# Patient Record
Sex: Male | Born: 1965 | Race: White | Hispanic: No | Marital: Single | Smoking: Never smoker
Health system: Southern US, Community
[De-identification: ages and names within clinical notes are randomized; demographics above are authoritative.]

## PROBLEM LIST (undated history)

## (undated) DIAGNOSIS — R51 Headache: Secondary | ICD-10-CM

## (undated) DIAGNOSIS — K5190 Ulcerative colitis, unspecified, without complications: Secondary | ICD-10-CM

## (undated) DIAGNOSIS — M545 Low back pain, unspecified: Secondary | ICD-10-CM

## (undated) DIAGNOSIS — G8929 Other chronic pain: Secondary | ICD-10-CM

## (undated) HISTORY — PX: OTHER SURGICAL HISTORY: SHX169

## (undated) HISTORY — DX: Ulcerative colitis, unspecified, without complications: K51.90

---

## 1999-02-05 ENCOUNTER — Ambulatory Visit (HOSPITAL_BASED_OUTPATIENT_CLINIC_OR_DEPARTMENT_OTHER): Admission: RE | Admit: 1999-02-05 | Discharge: 1999-02-05 | Payer: Self-pay | Admitting: General Surgery

## 1999-04-09 ENCOUNTER — Ambulatory Visit (HOSPITAL_BASED_OUTPATIENT_CLINIC_OR_DEPARTMENT_OTHER): Admission: RE | Admit: 1999-04-09 | Discharge: 1999-04-09 | Payer: Self-pay | Admitting: General Surgery

## 1999-08-12 ENCOUNTER — Ambulatory Visit (HOSPITAL_COMMUNITY): Admission: RE | Admit: 1999-08-12 | Discharge: 1999-08-12 | Payer: Self-pay | Admitting: Gastroenterology

## 1999-09-22 ENCOUNTER — Ambulatory Visit (HOSPITAL_BASED_OUTPATIENT_CLINIC_OR_DEPARTMENT_OTHER): Admission: RE | Admit: 1999-09-22 | Discharge: 1999-09-22 | Payer: Self-pay | Admitting: General Surgery

## 1999-10-30 ENCOUNTER — Emergency Department (HOSPITAL_COMMUNITY): Admission: EM | Admit: 1999-10-30 | Discharge: 1999-10-30 | Payer: Self-pay | Admitting: Emergency Medicine

## 2007-04-02 ENCOUNTER — Emergency Department (HOSPITAL_COMMUNITY): Admission: EM | Admit: 2007-04-02 | Discharge: 2007-04-02 | Payer: Self-pay | Admitting: Family Medicine

## 2008-01-12 ENCOUNTER — Emergency Department (HOSPITAL_COMMUNITY): Admission: EM | Admit: 2008-01-12 | Discharge: 2008-01-12 | Payer: Self-pay | Admitting: Family Medicine

## 2009-06-11 ENCOUNTER — Ambulatory Visit (HOSPITAL_BASED_OUTPATIENT_CLINIC_OR_DEPARTMENT_OTHER): Admission: RE | Admit: 2009-06-11 | Discharge: 2009-06-11 | Payer: Self-pay | Admitting: Orthopedic Surgery

## 2009-06-11 DIAGNOSIS — L02818 Cutaneous abscess of other sites: Secondary | ICD-10-CM

## 2009-06-11 DIAGNOSIS — L03818 Cellulitis of other sites: Secondary | ICD-10-CM

## 2009-06-24 ENCOUNTER — Encounter (INDEPENDENT_AMBULATORY_CARE_PROVIDER_SITE_OTHER): Payer: Self-pay | Admitting: *Deleted

## 2009-07-03 ENCOUNTER — Ambulatory Visit: Payer: Self-pay | Admitting: Infectious Diseases

## 2009-07-03 DIAGNOSIS — K5289 Other specified noninfective gastroenteritis and colitis: Secondary | ICD-10-CM

## 2009-07-03 DIAGNOSIS — A319 Mycobacterial infection, unspecified: Secondary | ICD-10-CM | POA: Insufficient documentation

## 2009-07-15 ENCOUNTER — Encounter: Payer: Self-pay | Admitting: Internal Medicine

## 2009-07-29 ENCOUNTER — Encounter: Payer: Self-pay | Admitting: Infectious Diseases

## 2010-03-18 ENCOUNTER — Emergency Department (HOSPITAL_COMMUNITY): Admission: EM | Admit: 2010-03-18 | Discharge: 2010-03-18 | Payer: Self-pay | Admitting: Family Medicine

## 2010-09-16 NOTE — Consult Note (Signed)
Summary: [facility]  [facility]   Imported By: Florinda Marker 08/28/2009 15:05:21  _____________________________________________________________________  External Attachment:    Type:   Image     Comment:   External Document

## 2010-11-20 LAB — ANAEROBIC CULTURE

## 2010-11-20 LAB — CULTURE, ROUTINE-ABSCESS: Culture: NO GROWTH

## 2010-11-20 LAB — AFB CULTURE WITH SMEAR (NOT AT ARMC)
Acid Fast Smear: NONE SEEN
Acid Fast Smear: NONE SEEN

## 2010-11-20 LAB — POCT HEMOGLOBIN-HEMACUE: Hemoglobin: 17.4 g/dL — ABNORMAL HIGH (ref 13.0–17.0)

## 2010-11-20 LAB — TISSUE CULTURE: Culture: NO GROWTH

## 2010-12-16 ENCOUNTER — Inpatient Hospital Stay (HOSPITAL_COMMUNITY)
Admission: RE | Admit: 2010-12-16 | Discharge: 2010-12-16 | Disposition: A | Discharge status: Home / Self Care | Payer: 59 | Source: Ambulatory Visit | Attending: Emergency Medicine | Admitting: Emergency Medicine

## 2010-12-17 ENCOUNTER — Inpatient Hospital Stay (INDEPENDENT_AMBULATORY_CARE_PROVIDER_SITE_OTHER)
Admission: RE | Admit: 2010-12-17 | Discharge: 2010-12-17 | Disposition: A | Discharge status: Home / Self Care | Payer: 59 | Source: Ambulatory Visit | Attending: Family Medicine | Admitting: Family Medicine

## 2010-12-17 ENCOUNTER — Ambulatory Visit (INDEPENDENT_AMBULATORY_CARE_PROVIDER_SITE_OTHER): Payer: 59

## 2010-12-17 DIAGNOSIS — S62309A Unspecified fracture of unspecified metacarpal bone, initial encounter for closed fracture: Secondary | ICD-10-CM

## 2011-01-02 NOTE — Op Note (Signed)
[place]. [facility]  Patient:    Joseph Gibson                    MRN: [id number] Proc. Date: 09/22/99 Adm. Date:  [id number] Attending:  Glenna Fellows Tappan                           Operative Report  PREOPERATIVE DIAGNOSIS:  Anal fissure.  POSTOPERATIVE DIAGNOSIS:  Anal fissure.  PROCEDURE:  Fissurectomy.  SURGEON:  Lorne Skeens. Hoxworth, M.D.  ANESTHESIA:  General.  INDICATIONS:  Joseph Gibson is a 45 year old white male who initially presented with a typical posterior midline fissure.  He has undergone internal anal sphincterotomy but failed to heal the fissure.  I have returned to the operating room on one previous occasion and performed an anal dilatation and a conservative fissurectomy.  He, however, has continued to have discomfort, bleeding, and a nonhealing fissure.  He has had a flexible sigmoidoscopy that is negative and an anal rectal ultrasound was unremarkable.  He has failed to improve on conservative management and after a consultation with GI, and with another of my associates, we have elected to return him to the operating room for a wider fissurectomy, in an attempt to get back to healthy tissue and stimulate healing.  The nature of the  procedure, indications, the risks of bleeding, infection, and nonhealing were discussed and understood preoperatively.  DESCRIPTION OF PROCEDURE:  The patient was brought to the operating room and placed in the supine position on the operating room table and general endotracheal anesthesia was induced.  Antibiotics were given preoperatively.  The perineum was sterilely prepped and draped.  Examination of the anorectum revealed good relaxation from the previous internal anal sphincterotomy with no evidence of fibrosis or tightness to the sphincter whatsoever.  There was a large posterior  midline fissure, as well as a smaller fissure in the right lateral position at he site of  this previous sphincterotomy. On both of these areas I excised anoderm nd mucosa back several mm to healthy tissue and extended this out, well out onto the perianal skin.  The base of each fissure was also curetted back to fresh tissue. There were no signs of hemorrhoids or other inflammatory processes in the anus.  The operative sites and perirectal area were infiltrated with Marcaine.  There as no significant bleeding at the end of the procedure.  A gauze dressing was applied, and the patient was taken to the recovery room in satisfactory condition. DD:  09/22/99 TD:  09/22/99 Job: [id number]

## 2011-01-02 NOTE — Procedures (Signed)
[place]. [facility]  Patient:    Joseph Gibson                    MRN: [id number] Proc. Date: 08/12/99 Adm. Date:  [id number] Attending:  Starr Sinclair CC:         Lorne Skeens. Hoxworth, M.D.                           Procedure Report  PROCEDURE:  Flexible sigmoidoscopy with a rectal and pelvic ultrasound.  ENDOSCOPIST:  Venita Lick. Pleas Koch., M.D. [facility].  REFERRING PHYSICIAN:  Sharlet Salina T. Hoxworth, M.D.  INDICATIONS:  A 45 year old white male with post defecation rectal pain and small volumes of hematochezia.  He has had a chronic anal fissure and underwent an anal sphincterotomy, and then, an internal sphincterotomy with dilation, and excision of the fissure.  Both operations were performed this past summer.  He has had persistent symptoms.  PHYSICAL EXAMINATION:  Chest clear to auscultation.  Cardiac regular rate and rhythm without murmurs.  Neurologic alert and oriented x 3.  ANESTHESIA:  Fentanyl 125 mcg IV and Versed 12.5 mg IV.  MONITORING:  Automated blood pressure monitor, pulse oximeter, and cardiac monitor. Low flow oxygen was given by nasal cannula throughout the procedure.  The procedure was well-tolerated with no immediate complications.  DESCRIPTION OF PROCEDURE:  After the nature of the procedure was discussed with the patient including the discussion of its risks, benefits and alternatives, he consented to proceed.  He was then comfortably sedated in the left lateral decubitus position.  Digital rectal examination revealed some small external anal tags.  There was a very subtle mucosal defect in the posterior midline aspect at the anal canal.  The remainder of the digital rectal examination was unremarkable. The Olympus echoendoscope was inserted into the rectal vault.  Air was insufflated, and the scope was advanced to approximately 30 cm in the proximal sigmoid colon. The sigmoid colon and rectum appeared  unremarkable.  I could not easily retroflex the echoendoscope, and therefore, detailed _________, and therefore, a retroflex view was not performed.  Close inspection on turning on the scope 360 degrees around the anal canal and the distal rectum revealed no obvious abnormalities.  Ultrasound imaging was then performed at 12 mHz and 7.5 mHz with the majority of the exam at 7.5 mHz.  Various ranges were applied with the majority of the exam  being performed at the 9 cm range.  The prostate gland and other adjacent pelvic structures appeared to be normal.  The rectal wall from the mid rectum into the  anal canal also appeared to be normal.  There was no evidence of any abscess, fistula, or mass lesion.  The rectal wall was measured and the proximal anal canal at 7 mm.  Video footage and still images were obtained.  Video photographs were  obtained.  The colon was decompressed and the echoendoscope was removed from the patient.  IMPRESSION: 1. Flexible sigmoidoscopy to 30 cm. 2. External anal tags. 3. Small mucosal defect in the posterior midline of the anal canal. 4. Normal rectal and pelvic ultrasound.  RECOMMENDATIONS:  Followup with Dr. Johna Sheriff. DD:  08/12/99 TD:  08/13/99 Job: [id number]

## 2011-05-13 LAB — GC/CHLAMYDIA PROBE AMP, GENITAL: Chlamydia, DNA Probe: NEGATIVE

## 2011-05-29 LAB — GC/CHLAMYDIA PROBE AMP, GENITAL: GC Probe Amp, Genital: NEGATIVE

## 2011-05-29 LAB — POCT URINALYSIS DIP (DEVICE)
Operator id: 270961
Protein, ur: NEGATIVE
Urobilinogen, UA: 1

## 2011-07-17 ENCOUNTER — Emergency Department (HOSPITAL_COMMUNITY)
Admission: EM | Admit: 2011-07-17 | Discharge: 2011-07-17 | Payer: 59 | Source: Home / Self Care | Attending: Emergency Medicine | Admitting: Emergency Medicine

## 2012-07-28 ENCOUNTER — Other Ambulatory Visit (HOSPITAL_COMMUNITY): Payer: Self-pay | Admitting: Orthopedic Surgery

## 2012-07-28 DIAGNOSIS — M25521 Pain in right elbow: Secondary | ICD-10-CM

## 2012-08-01 ENCOUNTER — Ambulatory Visit (HOSPITAL_COMMUNITY): Payer: Self-pay

## 2012-08-27 ENCOUNTER — Encounter: Payer: Self-pay | Admitting: Family Medicine

## 2012-08-27 ENCOUNTER — Ambulatory Visit (INDEPENDENT_AMBULATORY_CARE_PROVIDER_SITE_OTHER): Payer: 59 | Admitting: Family Medicine

## 2012-08-27 ENCOUNTER — Ambulatory Visit: Payer: 59

## 2012-08-27 VITALS — BP 126/79 | HR 83 | Temp 98.1°F | Resp 18 | Ht 70.75 in | Wt 167.0 lb

## 2012-08-27 DIAGNOSIS — M545 Low back pain, unspecified: Secondary | ICD-10-CM

## 2012-08-27 DIAGNOSIS — T148XXA Other injury of unspecified body region, initial encounter: Secondary | ICD-10-CM

## 2012-08-27 DIAGNOSIS — M79605 Pain in left leg: Secondary | ICD-10-CM

## 2012-08-27 DIAGNOSIS — M79609 Pain in unspecified limb: Secondary | ICD-10-CM

## 2012-08-27 MED ORDER — METHOCARBAMOL 500 MG PO TABS: 500.0000 mg | ORAL_TABLET | Freq: Every evening | ORAL | Status: DC | PRN

## 2012-08-27 MED ORDER — OXYCODONE-ACETAMINOPHEN 5-325 MG PO TABS: 1.0000 | ORAL_TABLET | Freq: Three times a day (TID) | ORAL | Status: DC | PRN

## 2012-08-27 MED ORDER — METHYLPREDNISOLONE 4 MG PO KIT: PACK | ORAL | Status: DC

## 2012-08-27 MED ORDER — DICLOFENAC SODIUM 75 MG PO TBEC: 75.0000 mg | DELAYED_RELEASE_TABLET | Freq: Two times a day (BID) | ORAL | Status: DC

## 2012-08-27 NOTE — Progress Notes (Signed)
[facility]:  Office Visit  Chief Complaint:  Chief Complaint  Patient presents with  . Back Pain    injury 2 wks ago LBP - fell last pm twisted w/pain radiating butt to calf    HPI: Joseph Gibson is a 47 y.o. 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 This took 2 weeks to resolve. Yesterday twisted back while in yard due to lost of balance. Has sharp, throbbing, pulsating pain in left mid buttcheek radiating to posterior thigh and calf stops at ankle. Has gone to chiropractor before for intermittent back problems. No prior back surgeries. +Pulsating, throbbing, tingling, Worse with movement.  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 Pain increases with prolong sitting, standing, moving.   Past Medical History  Diagnosis Date  . Ulcerative colitis    Past Surgical History  Procedure Date  . Sting ray  infection    History   Social History  . Marital Status: Single    Spouse Name: N/A    Number of Children: N/A  . Years of Education: N/A   Social History Main Topics  . Smoking status: Never Smoker   . Smokeless tobacco: None  . Alcohol Use: No  . Drug Use: No  . Sexually Active:    Other Topics Concern  . None   Social History Narrative  . None   Family History  Problem Relation Age of Onset  . Diabetes Father    Allergies no known allergies Prior to Admission medications   Medication Sig Start Date End Date Taking? Authorizing Provider  azaTHIOprine (IMURAN) 50 MG tablet Take 150 mg by mouth daily.   Yes Historical Provider, MD  mesalamine (ASACOL) 400 MG EC tablet Take 800 mg by mouth 2 (two) times daily. For crohns   Yes Historical Provider, MD     ROS: The patient denies fevers, chills, night sweats, unintentional weight loss, chest pain, palpitations, wheezing, dyspnea on exertion, nausea, vomiting, abdominal pain, dysuria, hematuria, melena, ,  weakness  All other systems have been reviewed and were otherwise negative with the exception of those mentioned in the HPI and as above.    PHYSICAL EXAM: Filed Vitals:   08/27/12 1024  BP: 126/79  Pulse: 83  Temp: 98.1 F (36.7 C)  Resp: 18   Filed Vitals:   08/27/12 1024  Height: 5' 10.75" (1.797 m)  Weight: 167 lb (75.751 kg)   Body mass index is 23.46 kg/(m^2).  General: Alert, no acute distress HEENT:  Normocephalic, atraumatic, oropharynx patent.  Cardiovascular:  Regular rate and rhythm, no rubs murmurs or gallops.  No Carotid bruits, radial pulse intact. No pedal edema.  Respiratory: Clear to auscultation bilaterally.  No wheezes, rales, or rhonchi.  No cyanosis, no use of accessory musculature GI: No organomegaly, abdomen is soft and non-tender, positive bowel sounds.  No masses. Skin: No rashes. Neurologic: Facial musculature symmetric. Psychiatric: Patient is appropriate throughout our interaction. Lymphatic: No cervical lymphadenopathy Musculoskeletal: Gait intact but stiff L-spine-tender at center of left buttock Decrease ROM in flexion, extension and SB 5/5 strength, sensation intact, no saddle anesthesia + straight leg   LABS:    EKG/XRAY:   Primary read interpreted by Dr. Conley Rolls at [facility]. No fracture/dislocation   ASSESSMENT/PLAN: Encounter Diagnoses  Name Primary?  . Low back pain radiating to left leg Yes  . Sprain and strain   .  Paresthesia and pain of left extremity    ? Sciatica vs Piriformis sydrome Rx: Steroid burst, Diclofenac 75 mg BID, Percocet 5/325 mg q8h, Robaxen  Gross drug SEs d/w patient. He denies any substance abuse/dependence issues ROM exercises. Advise to modify activities as needed. F/u in 1 week     Ferrell Flam PHUONG, DO 08/27/2012 11:51 AM

## 2012-08-28 ENCOUNTER — Encounter: Payer: Self-pay | Admitting: Family Medicine

## 2012-10-06 ENCOUNTER — Telehealth: Payer: Self-pay

## 2012-10-06 NOTE — Telephone Encounter (Signed)
Printed. Placed at front desk.  Called patient to advise.

## 2012-10-06 NOTE — Telephone Encounter (Signed)
Patient called and states he failed a drug test for the [employer].  He would like to know if the prescriptions he was issued could be the causing the positive test result.  Please call him on his cell [phone].

## 2012-10-07 ENCOUNTER — Encounter (HOSPITAL_COMMUNITY): Payer: Self-pay

## 2012-10-07 ENCOUNTER — Encounter (HOSPITAL_COMMUNITY): Payer: Self-pay | Admitting: Pharmacy Technician

## 2012-10-07 ENCOUNTER — Other Ambulatory Visit: Payer: Self-pay | Admitting: Neurosurgery

## 2012-10-09 MED ORDER — CEFAZOLIN SODIUM-DEXTROSE 2-3 GM-% IV SOLR
2.0000 g | INTRAVENOUS | Status: AC
  Administered 2012-10-10: 2 g via INTRAVENOUS
  Filled 2012-10-09: qty 50

## 2012-10-10 ENCOUNTER — Encounter (HOSPITAL_COMMUNITY): Payer: Self-pay | Admitting: *Deleted

## 2012-10-10 ENCOUNTER — Observation Stay (HOSPITAL_COMMUNITY)
Admission: RE | Admit: 2012-10-10 | Discharge: 2012-10-11 | Disposition: A | Discharge status: Home / Self Care | Payer: 59 | Source: Ambulatory Visit | Attending: Neurosurgery | Admitting: Neurosurgery

## 2012-10-10 ENCOUNTER — Inpatient Hospital Stay (HOSPITAL_COMMUNITY): Payer: 59 | Admitting: Anesthesiology

## 2012-10-10 ENCOUNTER — Encounter (HOSPITAL_COMMUNITY)
Admission: RE | Disposition: A | Discharge status: Home / Self Care | Payer: Self-pay | Source: Ambulatory Visit | Attending: Neurosurgery

## 2012-10-10 ENCOUNTER — Encounter (HOSPITAL_COMMUNITY): Payer: Self-pay | Admitting: Anesthesiology

## 2012-10-10 ENCOUNTER — Inpatient Hospital Stay (HOSPITAL_COMMUNITY): Payer: 59

## 2012-10-10 DIAGNOSIS — M5126 Other intervertebral disc displacement, lumbar region: Principal | ICD-10-CM | POA: Insufficient documentation

## 2012-10-10 HISTORY — DX: Low back pain, unspecified: M54.50

## 2012-10-10 HISTORY — DX: Other chronic pain: G89.29

## 2012-10-10 HISTORY — DX: Headache: R51

## 2012-10-10 HISTORY — PX: LUMBAR LAMINECTOMY/DECOMPRESSION MICRODISCECTOMY: SHX5026

## 2012-10-10 HISTORY — DX: Low back pain: M54.5

## 2012-10-10 LAB — BASIC METABOLIC PANEL WITH GFR
BUN: 14 mg/dL (ref 6–23)
CO2: 28 meq/L (ref 19–32)
Calcium: 8.9 mg/dL (ref 8.4–10.5)
Chloride: 103 meq/L (ref 96–112)
Creatinine, Ser: 1.15 mg/dL (ref 0.50–1.35)
GFR calc Af Amer: 87 mL/min — ABNORMAL LOW
GFR calc non Af Amer: 75 mL/min — ABNORMAL LOW
Glucose, Bld: 83 mg/dL (ref 70–99)
Potassium: 4 meq/L (ref 3.5–5.1)
Sodium: 138 meq/L (ref 135–145)

## 2012-10-10 LAB — CBC WITH DIFFERENTIAL/PLATELET
Eosinophils Absolute: 0 10*3/uL (ref 0.0–0.7)
Eosinophils Relative: 0 % (ref 0–5)
Lymphs Abs: 0.9 10*3/uL (ref 0.7–4.0)
MCH: 32 pg (ref 26.0–34.0)
MCV: 90.7 fL (ref 78.0–100.0)
Monocytes Relative: 14 % — ABNORMAL HIGH (ref 3–12)
Platelets: 180 10*3/uL (ref 150–400)
RBC: 4.53 MIL/uL (ref 4.22–5.81)

## 2012-10-10 SURGERY — LUMBAR LAMINECTOMY/DECOMPRESSION MICRODISCECTOMY 1 LEVEL
Anesthesia: General | Site: Back | Laterality: Left | Wound class: Clean

## 2012-10-10 MED ORDER — ALUM & MAG HYDROXIDE-SIMETH 200-200-20 MG/5ML PO SUSP: 30.0000 mL | Freq: Four times a day (QID) | ORAL | Status: DC | PRN

## 2012-10-10 MED ORDER — SODIUM CHLORIDE 0.9 % IJ SOLN
3.0000 mL | Freq: Two times a day (BID) | INTRAMUSCULAR | Status: DC
  Administered 2012-10-10 – 2012-10-11 (×2): 3 mL via INTRAVENOUS

## 2012-10-10 MED ORDER — ONDANSETRON HCL 4 MG/2ML IJ SOLN: 4.0000 mg | Freq: Four times a day (QID) | INTRAMUSCULAR | Status: DC | PRN

## 2012-10-10 MED ORDER — CEFAZOLIN SODIUM 1-5 GM-% IV SOLN
1.0000 g | Freq: Three times a day (TID) | INTRAVENOUS | Status: AC
  Administered 2012-10-11 (×2): 1 g via INTRAVENOUS
  Filled 2012-10-10 (×2): qty 50

## 2012-10-10 MED ORDER — LACTATED RINGERS IV SOLN
INTRAVENOUS | Status: DC | PRN
  Administered 2012-10-10 (×2): via INTRAVENOUS

## 2012-10-10 MED ORDER — BUPIVACAINE HCL (PF) 0.25 % IJ SOLN
INTRAMUSCULAR | Status: DC | PRN
  Administered 2012-10-10: 20 mL

## 2012-10-10 MED ORDER — OXYCODONE-ACETAMINOPHEN 5-325 MG PO TABS
1.0000 | ORAL_TABLET | ORAL | Status: DC | PRN
  Administered 2012-10-10 – 2012-10-11 (×2): 2 via ORAL
  Filled 2012-10-10 (×2): qty 2

## 2012-10-10 MED ORDER — 0.9 % SODIUM CHLORIDE (POUR BTL) OPTIME
TOPICAL | Status: DC | PRN
  Administered 2012-10-10: 1000 mL

## 2012-10-10 MED ORDER — SODIUM CHLORIDE 0.9 % IV SOLN: 250.0000 mL | INTRAVENOUS | Status: DC

## 2012-10-10 MED ORDER — ACETAMINOPHEN 650 MG RE SUPP: 650.0000 mg | RECTAL | Status: DC | PRN

## 2012-10-10 MED ORDER — OXYCODONE HCL 5 MG PO TABS: 5.0000 mg | ORAL_TABLET | Freq: Once | ORAL | Status: DC | PRN

## 2012-10-10 MED ORDER — MESALAMINE 400 MG PO CPDR
400.0000 mg | DELAYED_RELEASE_CAPSULE | Freq: Two times a day (BID) | ORAL | Status: DC
  Filled 2012-10-10 (×3): qty 1

## 2012-10-10 MED ORDER — PHENYLEPHRINE HCL 10 MG/ML IJ SOLN
INTRAMUSCULAR | Status: DC | PRN
  Administered 2012-10-10: 80 ug via INTRAVENOUS

## 2012-10-10 MED ORDER — HYDROMORPHONE HCL PF 1 MG/ML IJ SOLN: 0.5000 mg | INTRAMUSCULAR | Status: DC | PRN

## 2012-10-10 MED ORDER — ROCURONIUM BROMIDE 100 MG/10ML IV SOLN
INTRAVENOUS | Status: DC | PRN
  Administered 2012-10-10: 50 mg via INTRAVENOUS

## 2012-10-10 MED ORDER — ZOLPIDEM TARTRATE 5 MG PO TABS: 5.0000 mg | ORAL_TABLET | Freq: Every evening | ORAL | Status: DC | PRN

## 2012-10-10 MED ORDER — MUPIROCIN 2 % EX OINT
TOPICAL_OINTMENT | Freq: Once | CUTANEOUS | Status: AC
  Administered 2012-10-10: 1 via NASAL
  Filled 2012-10-10: qty 22

## 2012-10-10 MED ORDER — NEOSTIGMINE METHYLSULFATE 1 MG/ML IJ SOLN
INTRAMUSCULAR | Status: DC | PRN
  Administered 2012-10-10: 5 mg via INTRAVENOUS

## 2012-10-10 MED ORDER — LIDOCAINE HCL (CARDIAC) 20 MG/ML IV SOLN
INTRAVENOUS | Status: DC | PRN
  Administered 2012-10-10: 50 mg via INTRAVENOUS

## 2012-10-10 MED ORDER — THROMBIN 5000 UNITS EX SOLR
CUTANEOUS | Status: DC | PRN
  Administered 2012-10-10 (×2): 5000 [IU] via TOPICAL

## 2012-10-10 MED ORDER — SODIUM CHLORIDE 0.9 % IV SOLN
INTRAVENOUS | Status: AC
  Filled 2012-10-10: qty 500

## 2012-10-10 MED ORDER — SODIUM CHLORIDE 0.9 % IR SOLN
Status: DC | PRN
  Administered 2012-10-10: 17:00:00

## 2012-10-10 MED ORDER — ONDANSETRON HCL 4 MG/2ML IJ SOLN: 4.0000 mg | INTRAMUSCULAR | Status: DC | PRN

## 2012-10-10 MED ORDER — HYDROCODONE-ACETAMINOPHEN 5-325 MG PO TABS: 1.0000 | ORAL_TABLET | ORAL | Status: DC | PRN

## 2012-10-10 MED ORDER — PHENOL 1.4 % MT LIQD: 1.0000 | OROMUCOSAL | Status: DC | PRN

## 2012-10-10 MED ORDER — FENTANYL CITRATE 0.05 MG/ML IJ SOLN
INTRAMUSCULAR | Status: DC | PRN
  Administered 2012-10-10 (×2): 50 ug via INTRAVENOUS
  Administered 2012-10-10: 100 ug via INTRAVENOUS
  Administered 2012-10-10: 50 ug via INTRAVENOUS

## 2012-10-10 MED ORDER — OXYCODONE HCL 5 MG/5ML PO SOLN: 5.0000 mg | Freq: Once | ORAL | Status: DC | PRN

## 2012-10-10 MED ORDER — HEMOSTATIC AGENTS (NO CHARGE) OPTIME
TOPICAL | Status: DC | PRN
  Administered 2012-10-10: 1 via TOPICAL

## 2012-10-10 MED ORDER — MENTHOL 3 MG MT LOZG: 1.0000 | LOZENGE | OROMUCOSAL | Status: DC | PRN

## 2012-10-10 MED ORDER — KETOROLAC TROMETHAMINE 30 MG/ML IJ SOLN
30.0000 mg | Freq: Four times a day (QID) | INTRAMUSCULAR | Status: DC
  Administered 2012-10-11 (×2): 30 mg via INTRAVENOUS
  Filled 2012-10-10 (×6): qty 1

## 2012-10-10 MED ORDER — MUPIROCIN 2 % EX OINT
TOPICAL_OINTMENT | CUTANEOUS | Status: AC
  Filled 2012-10-10: qty 22

## 2012-10-10 MED ORDER — BACITRACIN 50000 UNITS IM SOLR
INTRAMUSCULAR | Status: AC
  Filled 2012-10-10: qty 1

## 2012-10-10 MED ORDER — ACETAMINOPHEN 325 MG PO TABS: 650.0000 mg | ORAL_TABLET | ORAL | Status: DC | PRN

## 2012-10-10 MED ORDER — SODIUM CHLORIDE 0.9 % IJ SOLN: 3.0000 mL | INTRAMUSCULAR | Status: DC | PRN

## 2012-10-10 MED ORDER — ONDANSETRON HCL 4 MG/2ML IJ SOLN
INTRAMUSCULAR | Status: DC | PRN
  Administered 2012-10-10: 4 mg via INTRAVENOUS

## 2012-10-10 MED ORDER — CYCLOBENZAPRINE HCL 10 MG PO TABS
10.0000 mg | ORAL_TABLET | Freq: Three times a day (TID) | ORAL | Status: DC | PRN
  Administered 2012-10-10: 10 mg via ORAL
  Filled 2012-10-10: qty 1

## 2012-10-10 MED ORDER — KETOROLAC TROMETHAMINE 30 MG/ML IJ SOLN
INTRAMUSCULAR | Status: DC | PRN
  Administered 2012-10-10: 30 mg via INTRAVENOUS

## 2012-10-10 MED ORDER — MESALAMINE 400 MG PO TBEC: 800.0000 mg | DELAYED_RELEASE_TABLET | Freq: Two times a day (BID) | ORAL | Status: DC

## 2012-10-10 MED ORDER — AZATHIOPRINE 50 MG PO TABS
150.0000 mg | ORAL_TABLET | Freq: Every day | ORAL | Status: DC
  Filled 2012-10-10: qty 3

## 2012-10-10 MED ORDER — MIDAZOLAM HCL 5 MG/5ML IJ SOLN
INTRAMUSCULAR | Status: DC | PRN
  Administered 2012-10-10: 2 mg via INTRAVENOUS

## 2012-10-10 MED ORDER — PROPOFOL 10 MG/ML IV BOLUS
INTRAVENOUS | Status: DC | PRN
  Administered 2012-10-10: 200 mg via INTRAVENOUS

## 2012-10-10 MED ORDER — GLYCOPYRROLATE 0.2 MG/ML IJ SOLN
INTRAMUSCULAR | Status: DC | PRN
  Administered 2012-10-10: 0.6 mg via INTRAVENOUS

## 2012-10-10 MED ORDER — SENNA 8.6 MG PO TABS
1.0000 | ORAL_TABLET | Freq: Two times a day (BID) | ORAL | Status: DC
  Administered 2012-10-10: 8.6 mg via ORAL
  Filled 2012-10-10 (×3): qty 1

## 2012-10-10 MED ORDER — HYDROMORPHONE HCL PF 1 MG/ML IJ SOLN: 0.2500 mg | INTRAMUSCULAR | Status: DC | PRN

## 2012-10-10 MED ORDER — DEXAMETHASONE SODIUM PHOSPHATE 10 MG/ML IJ SOLN
10.0000 mg | INTRAMUSCULAR | Status: AC
  Administered 2012-10-10: 10 mg via INTRAVENOUS
  Filled 2012-10-10: qty 1

## 2012-10-10 SURGICAL SUPPLY — 54 items
ADH SKN CLS APL DERMABOND .7 (GAUZE/BANDAGES/DRESSINGS) ×1
APL SKNCLS STERI-STRIP NONHPOA (GAUZE/BANDAGES/DRESSINGS) ×1
BAG DECANTER FOR FLEXI CONT (MISCELLANEOUS) ×2 IMPLANT
BENZOIN TINCTURE PRP APPL 2/3 (GAUZE/BANDAGES/DRESSINGS) ×2 IMPLANT
BLADE SURG ROTATE 9660 (MISCELLANEOUS) ×1 IMPLANT
BRUSH SCRUB EZ PLAIN DRY (MISCELLANEOUS) ×2 IMPLANT
BUR CUTTER 7.0 ROUND (BURR) ×2 IMPLANT
CANISTER SUCTION 2500CC (MISCELLANEOUS) ×2 IMPLANT
CLOTH BEACON ORANGE TIMEOUT ST (SAFETY) ×2 IMPLANT
CONT SPEC 4OZ CLIKSEAL STRL BL (MISCELLANEOUS) ×2 IMPLANT
DECANTER SPIKE VIAL GLASS SM (MISCELLANEOUS) ×2 IMPLANT
DERMABOND ADVANCED (GAUZE/BANDAGES/DRESSINGS) ×1
DERMABOND ADVANCED .7 DNX12 (GAUZE/BANDAGES/DRESSINGS) ×1 IMPLANT
DRAPE LAPAROTOMY 100X72X124 (DRAPES) ×2 IMPLANT
DRAPE MICROSCOPE LEICA (MISCELLANEOUS) ×1 IMPLANT
DRAPE MICROSCOPE ZEISS OPMI (DRAPES) ×1 IMPLANT
DRAPE POUCH INSTRU U-SHP 10X18 (DRAPES) ×2 IMPLANT
DRAPE PROXIMA HALF (DRAPES) IMPLANT
DRAPE SURG 17X23 STRL (DRAPES) ×4 IMPLANT
ELECT REM PT RETURN 9FT ADLT (ELECTROSURGICAL) ×2
ELECTRODE REM PT RTRN 9FT ADLT (ELECTROSURGICAL) ×1 IMPLANT
GAUZE SPONGE 4X4 16PLY XRAY LF (GAUZE/BANDAGES/DRESSINGS) IMPLANT
GLOVE BIO SURGEON STRL SZ 6.5 (GLOVE) ×2 IMPLANT
GLOVE BIOGEL PI IND STRL 6.5 (GLOVE) IMPLANT
GLOVE BIOGEL PI INDICATOR 6.5 (GLOVE) ×1
GLOVE ECLIPSE 7.5 STRL STRAW (GLOVE) ×1 IMPLANT
GLOVE ECLIPSE 8.5 STRL (GLOVE) ×2 IMPLANT
GLOVE EXAM NITRILE LRG STRL (GLOVE) IMPLANT
GLOVE EXAM NITRILE MD LF STRL (GLOVE) ×1 IMPLANT
GLOVE EXAM NITRILE XL STR (GLOVE) IMPLANT
GLOVE EXAM NITRILE XS STR PU (GLOVE) IMPLANT
GLOVE INDICATOR 8.0 STRL GRN (GLOVE) ×1 IMPLANT
GOWN BRE IMP SLV AUR LG STRL (GOWN DISPOSABLE) ×1 IMPLANT
GOWN BRE IMP SLV AUR XL STRL (GOWN DISPOSABLE) ×3 IMPLANT
GOWN STRL REIN 2XL LVL4 (GOWN DISPOSABLE) IMPLANT
KIT BASIN OR (CUSTOM PROCEDURE TRAY) ×2 IMPLANT
KIT ROOM TURNOVER OR (KITS) ×2 IMPLANT
NDL SPNL 22GX3.5 QUINCKE BK (NEEDLE) ×1 IMPLANT
NEEDLE HYPO 22GX1.5 SAFETY (NEEDLE) ×2 IMPLANT
NEEDLE SPNL 22GX3.5 QUINCKE BK (NEEDLE) ×2 IMPLANT
NS IRRIG 1000ML POUR BTL (IV SOLUTION) ×2 IMPLANT
PACK LAMINECTOMY NEURO (CUSTOM PROCEDURE TRAY) ×2 IMPLANT
PAD ARMBOARD 7.5X6 YLW CONV (MISCELLANEOUS) ×6 IMPLANT
RUBBERBAND STERILE (MISCELLANEOUS) ×4 IMPLANT
SPONGE GAUZE 4X4 12PLY (GAUZE/BANDAGES/DRESSINGS) ×2 IMPLANT
SPONGE SURGIFOAM ABS GEL SZ50 (HEMOSTASIS) ×2 IMPLANT
STRIP CLOSURE SKIN 1/2X4 (GAUZE/BANDAGES/DRESSINGS) ×2 IMPLANT
SUT VIC AB 2-0 CT1 18 (SUTURE) ×2 IMPLANT
SUT VIC AB 3-0 SH 8-18 (SUTURE) ×2 IMPLANT
SYR 20ML ECCENTRIC (SYRINGE) ×2 IMPLANT
TAPE CLOTH SURG 4X10 WHT LF (GAUZE/BANDAGES/DRESSINGS) ×1 IMPLANT
TOWEL OR 17X24 6PK STRL BLUE (TOWEL DISPOSABLE) ×2 IMPLANT
TOWEL OR 17X26 10 PK STRL BLUE (TOWEL DISPOSABLE) ×2 IMPLANT
WATER STERILE IRR 1000ML POUR (IV SOLUTION) ×2 IMPLANT

## 2012-10-10 NOTE — Transfer of Care (Signed)
Immediate Anesthesia Transfer of Care Note  Patient: Joseph Gibson  Procedure(s) Performed: Procedure(s) with comments: LUMBAR LAMINECTOMY/DECOMPRESSION MICRODISCECTOMY 1 LEVEL (Left) - Left Lumbar Five-Sacral One Microdiskectomy  Patient Location: PACU  Anesthesia Type:General  Level of Consciousness: awake and alert   Airway & Oxygen Therapy: Patient Spontanous Breathing and Patient connected to face mask oxygen  Post-op Assessment: Report given to PACU RN and Post -op Vital signs reviewed and stable  Post vital signs: Reviewed and stable  Complications: No apparent anesthesia complications

## 2012-10-10 NOTE — Anesthesia Preprocedure Evaluation (Addendum)
Anesthesia Evaluation  Patient identified by MRN, date of birth, ID band Patient awake    Reviewed: Allergy & Precautions, H&P , NPO status , Patient's Chart, lab work & pertinent test results, reviewed documented beta blocker date and time   Airway Mallampati: II  Neck ROM: full    Dental  (+) Teeth Intact and Dental Advisory Given   Pulmonary          Cardiovascular     Neuro/Psych  Headaches,    GI/Hepatic PUD, Ulcerative colitis   Endo/Other    Renal/GU      Musculoskeletal   Abdominal   Peds  Hematology   Anesthesia Other Findings   Reproductive/Obstetrics                          Anesthesia Physical Anesthesia Plan  ASA: II  Anesthesia Plan: General   Post-op Pain Management:    Induction: Intravenous  Airway Management Planned: Oral ETT  Additional Equipment:   Intra-op Plan:   Post-operative Plan: Extubation in OR  Informed Consent: I have reviewed the patients History and Physical, chart, labs and discussed the procedure including the risks, benefits and alternatives for the proposed anesthesia with the patient or authorized representative who has indicated his/her understanding and acceptance.     Plan Discussed with: CRNA, Surgeon and Anesthesiologist  Anesthesia Plan Comments:        Anesthesia Quick Evaluation

## 2012-10-10 NOTE — Op Note (Signed)
Date of procedure: 10/10/2012  Date of dictation: Same  Service: Neurosurgery  Preoperative diagnosis: Left L5-S1 herniated nucleus pulposus with radiculopathy  Postoperative diagnosis: Same  Procedure Name: Left L5-S1 laminotomy and microdiscectomy  Surgeon:Maclaine Ahola A.Verdia Bolt, M.D.  Asst. Surgeon: Newell Coral  Anesthesia: General  Indication: 47 year old male with back and left lower chamois pain paresthesias and weakness consistent with a left-sided S1 radiculopathy. Workup demonstrates a large left-sided L5-S1 disc herniation with compression the left-sided S1 nerve root. Patient presents now for laminotomy and microdiscectomy in hopes of improving his symptoms.  Operative note: After induction anesthesia, patient positioned prone onto Wilson frame and appropriately padded. Lumbar region prepped and draped. Incision made overlying L5-S1 interspace. Supper periosteal dissection performed on the left side. Self-retaining retractors placed x-ray taken level confirmed. Laminotomy performed using high-speed drill and Kerrison rongeurs to remove the inferior aspect the lamina above medial aspect the L5-S1 facet joint and the superior rim of the S1 lamina. Ligament flavum was elevated and resected in piecemeal fashion to Kerrison rongeurs. Underlying thecal sac and S1 nerve root were notified. Microscope brought field these might resection a left-sided S1 nerve root and intravenous plexus. Epidural venous plexus was coagulated and cut. Thecal sac and S1 nerve root gently mobilized track towards midline. A large amount of free disc herniation encountered. This dissected free and then incised a 15 blade. Disc herniation was removed in piecemeal fashion using pituitary rongeurs. The spaces in her. Intradiscal decompression was also performed removing all loose her oxygen dispersement and interspace. This went very thorough decompression had been achieved. There is no his injury to thecal sac and nerve roots.  Wound is then irrigated out like solution. Gelfoam placed topically for hemostasis which Ascent be good. Microscope for her system were removed. Hemostasis of the muscle was achieved with electric R. Wounds and close in layers with Vicryl sutures. Steri-Strips and sterile dressing were applied. There were no apparent complications. Patient tolerated well and returns they're covering postop.

## 2012-10-10 NOTE — Brief Op Note (Signed)
10/10/2012  5:29 PM  PATIENT:  Joseph Gibson  47 y.o. male  PRE-OPERATIVE DIAGNOSIS:  HNP  POST-OPERATIVE DIAGNOSIS:  HNP  PROCEDURE:  Procedure(s) with comments: LUMBAR LAMINECTOMY/DECOMPRESSION MICRODISCECTOMY 1 LEVEL (Left) - Left Lumbar Five-Sacral One Microdiskectomy  SURGEON:  Surgeon(s) and Role:    * Temple Pacini, MD - Primary    * Hewitt Shorts, MD - Assisting  PHYSICIAN ASSISTANT:   ASSISTANTS:    ANESTHESIA:   general  EBL:  Total I/O In: 1000 [I.V.:1000] Out: 100 [Blood:100]  BLOOD ADMINISTERED:none  DRAINS: none   LOCAL MEDICATIONS USED:  MARCAINE     SPECIMEN:  No Specimen  DISPOSITION OF SPECIMEN:  N/A  COUNTS:  YES  TOURNIQUET:  * No tourniquets in log *  DICTATION: .Dragon Dictation  PLAN OF CARE: Admit for overnight observation  PATIENT DISPOSITION:  PACU - hemodynamically stable.   Delay start of Pharmacological VTE agent (>24hrs) due to surgical blood loss or risk of bleeding: yes

## 2012-10-10 NOTE — Anesthesia Procedure Notes (Signed)
Procedure Name: Intubation Date/Time: 10/10/2012 4:13 PM Performed by: Brien Mates DOBSON Pre-anesthesia Checklist: Patient identified, Emergency Drugs available, Suction available, Patient being monitored and Timeout performed Patient Re-evaluated:Patient Re-evaluated prior to inductionOxygen Delivery Method: Circle system utilized Preoxygenation: Pre-oxygenation with 100% oxygen Intubation Type: IV induction Ventilation: Mask ventilation without difficulty and Oral airway inserted - appropriate to patient size Laryngoscope Size: Miller and 2 Grade View: Grade I Tube size: 7.5 mm Number of attempts: 1 Airway Equipment and Method: Stylet Placement Confirmation: ETT inserted through vocal cords under direct vision,  positive ETCO2 and breath sounds checked- equal and bilateral Secured at: 22 cm Tube secured with: Tape Dental Injury: Teeth and Oropharynx as per pre-operative assessment

## 2012-10-10 NOTE — Anesthesia Postprocedure Evaluation (Signed)
  Anesthesia Post-op Note  Patient: Joseph Gibson  Procedure(s) Performed: Procedure(s) with comments: LUMBAR LAMINECTOMY/DECOMPRESSION MICRODISCECTOMY 1 LEVEL (Left) - Left Lumbar Five-Sacral One Microdiskectomy  Patient Location: PACU  Anesthesia Type:General  Level of Consciousness: awake, alert , oriented and patient cooperative  Airway and Oxygen Therapy: Patient Spontanous Breathing and Patient connected to nasal cannula oxygen  Post-op Pain: mild  Post-op Assessment: Post-op Vital signs reviewed, Patient's Cardiovascular Status Stable, Respiratory Function Stable, Patent Airway, No signs of Nausea or vomiting and Pain level controlled  Post-op Vital Signs: Reviewed and stable  Complications: No apparent anesthesia complications

## 2012-10-10 NOTE — H&P (Signed)
Joseph Gibson is an 47 y.o. male.   Chief Complaint: Left leg pain HPI: 47 year old male with severe left lower extremity pain with sensory loss and weakness. Workup demonstrates evidence of a large left-sided L5-S1 disc herniation with compression of the left-sided S1 nerve root. Patient has been counseled as to his options and he presents now for microdiscectomy in hopes of improving his symptoms.  Past Medical History  Diagnosis Date  . Ulcerative colitis   . Headache     "occas"  . Chronic low back pain     Past Surgical History  Procedure Laterality Date  . Sting ray  infection      Family History  Problem Relation Age of Onset  . Diabetes Father    Social History:  reports that he has never smoked. He does not have any smokeless tobacco history on file. He reports that he does not drink alcohol or use illicit drugs.  Allergies: No Known Allergies  Medications Prior to Admission  Medication Sig Dispense Refill  . Aspirin-Acetaminophen-Caffeine (GOODYS EXTRA STRENGTH PO) Take 1 packet by mouth 2 (two) times daily as needed (for pain).      Marland Kitchen azaTHIOprine (IMURAN) 50 MG tablet Take 150 mg by mouth daily.      Marland Kitchen ibuprofen (ADVIL,MOTRIN) 200 MG tablet Take 400 mg by mouth 2 (two) times daily as needed for pain.      . mesalamine (ASACOL) 400 MG EC tablet Take 800 mg by mouth 2 (two) times daily. For crohns        Results for orders placed during the hospital encounter of 10/10/12 (from the past 48 hour(s))  BASIC METABOLIC PANEL     Status: Abnormal   Collection Time    10/10/12  1:00 PM      Result Value Range   Sodium 138  135 - 145 mEq/L   Potassium 4.0  3.5 - 5.1 mEq/L   Chloride 103  96 - 112 mEq/L   CO2 28  19 - 32 mEq/L   Glucose, Bld 83  70 - 99 mg/dL   BUN 14  6 - 23 mg/dL   Creatinine, Ser 1.61  0.50 - 1.35 mg/dL   Calcium 8.9  8.4 - 09.6 mg/dL   GFR calc non Af Amer 75 (*) >90 mL/min   GFR calc Af Amer 87 (*) >90 mL/min   Comment:            The eGFR has  been calculated     using the CKD EPI equation.     This calculation has not been     validated in all clinical     situations.     eGFR's persistently     <90 mL/min signify     possible Chronic Kidney Disease.  CBC WITH DIFFERENTIAL     Status: Abnormal   Collection Time    10/10/12  1:00 PM      Result Value Range   WBC 5.6  4.0 - 10.5 K/uL   RBC 4.53  4.22 - 5.81 MIL/uL   Hemoglobin 14.5  13.0 - 17.0 g/dL   HCT 04.5  40.9 - 81.1 %   MCV 90.7  78.0 - 100.0 fL   MCH 32.0  26.0 - 34.0 pg   MCHC 35.3  30.0 - 36.0 g/dL   RDW 91.4  78.2 - 95.6 %   Platelets 180  150 - 400 K/uL   Neutrophils Relative 69  43 - 77 %  Neutro Abs 3.9  1.7 - 7.7 K/uL   Lymphocytes Relative 16  12 - 46 %   Lymphs Abs 0.9  0.7 - 4.0 K/uL   Monocytes Relative 14 (*) 3 - 12 %   Monocytes Absolute 0.8  0.1 - 1.0 K/uL   Eosinophils Relative 0  0 - 5 %   Eosinophils Absolute 0.0  0.0 - 0.7 K/uL   Basophils Relative 0  0 - 1 %   Basophils Absolute 0.0  0.0 - 0.1 K/uL  SURGICAL PCR SCREEN     Status: None   Collection Time    10/10/12  1:20 PM      Result Value Range   MRSA, PCR NEGATIVE  NEGATIVE   Staphylococcus aureus NEGATIVE  NEGATIVE   Comment:            The Xpert SA Assay (FDA     approved for NASAL specimens     in patients over 47 years of age),     is one component of     a comprehensive surveillance     program.  Test performance has     been validated by [employer] for patients greater     than or equal to 69 year old.     It is not intended     to diagnose infection nor to     guide or monitor treatment.   No results found.  Review of Systems  Constitutional: Negative.   HENT: Negative.   Eyes: Negative.   Respiratory: Negative.   Cardiovascular: Negative.   Gastrointestinal: Negative.   Genitourinary: Negative.   Musculoskeletal: Negative.   Skin: Negative.   Neurological: Negative.   Endo/Heme/Allergies: Negative.   Psychiatric/Behavioral: Negative.     Blood  pressure 147/81, pulse 84, temperature 97.3 F (36.3 C), temperature source Oral, resp. rate 20, height 5\' 11"  (1.803 m), weight 71.4 kg (157 lb 6.5 oz), SpO2 98.00%. Physical Exam  Constitutional: He is oriented to person, place, and time. He appears well-developed and well-nourished. No distress.  HENT:  Head: Normocephalic and atraumatic.  Right Ear: External ear normal.  Left Ear: External ear normal.  Nose: Nose normal.  Mouth/Throat: Oropharynx is clear and moist.  Eyes: Conjunctivae and EOM are normal. Pupils are equal, round, and reactive to light. Right eye exhibits no discharge. Left eye exhibits no discharge.  Neck: Normal range of motion. Neck supple. No tracheal deviation present. No thyromegaly present.  Cardiovascular: Normal rate, regular rhythm, normal heart sounds and intact distal pulses.  Exam reveals no friction rub.   No murmur heard. Respiratory: Effort normal and breath sounds normal. No respiratory distress. He has no wheezes.  GI: Soft. Bowel sounds are normal. He exhibits no distension. There is no tenderness.  Musculoskeletal: Normal range of motion. He exhibits no edema and no tenderness.  Neurological: He is alert and oriented to person, place, and time. He has normal reflexes. He displays normal reflexes. No cranial nerve deficit. He exhibits normal muscle tone. Coordination normal.  Positive left straight-leg raising sign. Left gastrocnemius 4 minus over 5 with wasting. Left S1 dermatome with diminished sensation to pinprick and light touch. Left Achilles reflex absent  Skin: Skin is warm and dry. No rash noted. He is not diaphoretic. No erythema. No pallor.  Psychiatric: He has a normal mood and affect. His behavior is normal. Judgment and thought content normal.     Assessment/Plan Left L5-S1 herniated nucleus pulposus with radiculopathy. Plan  left L5-S1 laminotomy and microdiscectomy. Risks and benefits have been explained. Patient wishes to  proceed.  [name] A 10/10/2012, 3:35 PM

## 2012-10-10 NOTE — Preoperative (Signed)
Beta Blockers   Reason not to administer Beta Blockers:Not Applicable 

## 2012-10-10 NOTE — Progress Notes (Signed)
Call to Dr. Jordan Likes, reported that orders for OR needed to be signed & released.

## 2012-10-11 ENCOUNTER — Encounter (HOSPITAL_COMMUNITY): Payer: Self-pay | Admitting: Neurosurgery

## 2012-10-11 NOTE — Progress Notes (Signed)
Pt doing well. Pt given D/C instructions with verbal understanding. Pt D/C'd home via wheelchair @ 1125 per MD order. Rema Fendt, RN

## 2012-10-11 NOTE — Progress Notes (Signed)
UR COMPLETED  

## 2012-10-11 NOTE — Discharge Summary (Signed)
Physician Discharge Summary  Patient ID: ORA MCNATT MRN: [id number] DOB/AGE: [birth year] 47 y.o.  Admit date: 10/10/2012 Discharge date: 10/11/2012  Admission Diagnoses:  Discharge Diagnoses:  Active Problems:   * No active hospital problems. *   Discharged Condition: good  Hospital Course: Patient admitted to the hospital where he underwent uncomplicated left-sided L5-S1 laminotomy and microdiscectomy. Postoperatively he is done well. Preoperative pain much improved. Ready for discharge home.  Consults:   Significant Diagnostic Studies:   Treatments:   Discharge Exam: Blood pressure 134/75, pulse 95, temperature 98.7 F (37.1 C), temperature source Oral, resp. rate 16, height 5\' 11"  (1.803 m), weight 71.4 kg (157 lb 6.5 oz), SpO2 94.00%. Awake and alert. Oriented and appropriate. Cranial nerve function is intact. Motor and sensory function of the extremities normal. Wound clean and dry. Chest and abdomen benign.  Disposition: ED Dismiss - Diverted Elsewhere     Medication List    TAKE these medications       azaTHIOprine 50 MG tablet  Commonly known as:  IMURAN  Take 150 mg by mouth daily.     GOODYS EXTRA STRENGTH PO  Take 1 packet by mouth 2 (two) times daily as needed (for pain).     ibuprofen 200 MG tablet  Commonly known as:  ADVIL,MOTRIN  Take 400 mg by mouth 2 (two) times daily as needed for pain.     mesalamine 400 MG EC tablet  Commonly known as:  ASACOL  Take 800 mg by mouth 2 (two) times daily. For crohns           Follow-up Information   Follow up with Eleina Jergens A, MD. Call in 1 week. (Ask for Lurena Joiner)    Contact information:   [address] [place] [address] [phone]       Signed: Julio Sicks A 10/11/2012, 10:38 AM

## 2013-05-19 ENCOUNTER — Emergency Department (HOSPITAL_COMMUNITY)
Admission: EM | Admit: 2013-05-19 | Discharge: 2013-05-19 | Disposition: A | Discharge status: Home / Self Care | Payer: 59 | Source: Home / Self Care | Attending: Emergency Medicine | Admitting: Emergency Medicine

## 2013-05-19 ENCOUNTER — Other Ambulatory Visit (HOSPITAL_COMMUNITY)
Admission: RE | Admit: 2013-05-19 | Discharge: 2013-05-19 | Disposition: A | Discharge status: Home / Self Care | Payer: 59 | Source: Ambulatory Visit | Attending: Emergency Medicine | Admitting: Emergency Medicine

## 2013-05-19 ENCOUNTER — Encounter (HOSPITAL_COMMUNITY): Payer: Self-pay | Admitting: Emergency Medicine

## 2013-05-19 DIAGNOSIS — Z113 Encounter for screening for infections with a predominantly sexual mode of transmission: Secondary | ICD-10-CM | POA: Insufficient documentation

## 2013-05-19 DIAGNOSIS — Z202 Contact with and (suspected) exposure to infections with a predominantly sexual mode of transmission: Secondary | ICD-10-CM

## 2013-05-19 DIAGNOSIS — Z9189 Other specified personal risk factors, not elsewhere classified: Secondary | ICD-10-CM

## 2013-05-19 DIAGNOSIS — R300 Dysuria: Secondary | ICD-10-CM

## 2013-05-19 LAB — POCT URINALYSIS DIP (DEVICE)
Glucose, UA: NEGATIVE mg/dL
Ketones, ur: NEGATIVE mg/dL
Specific Gravity, Urine: >=1.03 (ref 1.005–1.030)

## 2013-05-19 NOTE — ED Notes (Signed)
Patient reports having dark urine, odor to urine and an itch.  Patient asks to have std testing.

## 2013-05-19 NOTE — ED Provider Notes (Signed)
Chief Complaint:   Chief Complaint  Patient presents with  . Urinary Tract Infection    History of Present Illness:   Joseph Gibson is a 47 year old male who comes in tonight concerned about urinary symptoms. His urine has been dark yellow in color and has an unusual odor. He has started taking a multivitamin recently. He denies any dysuria, frequency, urgency, or hematuria. He's had no urethral discharge, penile pain, or penile lesions. He does have some itching of the head of the penis at times. His urinary stream is good he denies any prostatic pain, lower back, or lower double pain. He's had no fever, chills, nausea, or vomiting. He is sexually active with a single male partner. He does not use condoms for protection.  Review of Systems:  Other than noted above, the patient denies any of the following symptoms: General:  No fevers, chills, sweats, aches, or fatigue. GI:  No abdominal pain, back pain, nausea, vomiting, diarrhea, or constipation. GU:  No dysuria, frequency, urgency, hematuria, urethral discharge, penile lesions, penile pain, testicular pain, swelling, or mass, inguinal lymphadenopathy or incontinence.  PMFSH:  Past medical history, family history, social history, meds, and allergies were reviewed.  He has shows disease and takes Imuran and Asacol.  Physical Exam:   Vital signs:  BP 125/89  Pulse 99  Temp(Src) 98.4 F (36.9 C) (Oral)  Resp 16  SpO2 100% Gen:  Alert, oriented, in no distress. Lungs:  Clear to auscultation, no wheezes, rales or rhonchi. Heart:  Regular rhythm, no gallop or murmer. Abdomen:  Flat and soft.  No tenderness to palpation, guarding, or rebound.  No hepato-splenomegaly or mass.  Bowel sounds were normally active.  No hernia. Genital exam:  Was completely unremarkable. There was noted urethral discharge, no penile lesions, ulcers, blisters, or rash. He is known to lymphadenopathy. Testes were normal and nontender. Back:  No CVA tenderness.   Skin:  Clear, warm and dry.  Labs:   Results for orders placed during the hospital encounter of 05/19/13  POCT URINALYSIS DIP (DEVICE)      Result Value Range   Glucose, UA NEGATIVE  NEGATIVE mg/dL   Bilirubin Urine NEGATIVE  NEGATIVE   Ketones, ur NEGATIVE  NEGATIVE mg/dL   Specific Gravity, Urine >=1.030  1.005 - 1.030   Hgb urine dipstick NEGATIVE  NEGATIVE   pH 7.0  5.0 - 8.0   Protein, ur NEGATIVE  NEGATIVE mg/dL   Urobilinogen, UA 1.0  0.0 - 1.0 mg/dL   Nitrite NEGATIVE  NEGATIVE   Leukocytes, UA NEGATIVE  NEGATIVE    DNA probes for gonorrhea, Chlamydia, Trichomonas were obtained as well as urine culture.  Assessment: The primary encounter diagnosis was Dysuria. A diagnosis of Potential exposure to STD was also pertinent to this visit.   There is no evidence of urinary tract infection or an STD tonight. The patient is inclined to hold off on treatment until results of tests come back. I told him we should everything back early next week and will give him a call if there any abnormal.  Plan:   1.  Meds:  The following meds were prescribed:   Discharge Medication List as of 05/19/2013  4:29 PM      2.  Patient Education/Counseling:  The patient was given appropriate handouts, self care instructions, and instructed in symptomatic relief.  Patient encouraged to use a condom with all intercourse for STD protection.  3.  Follow up:  The patient was told to follow  up if no better in 3 to 4 days, if becoming worse in any way, and given some red flag symptoms such as fever, difficulty urinating, or worsening pain which would prompt immediate return.  Follow up here if needed.      Reuben Likes, MD 05/19/13 [phone]

## 2013-05-20 LAB — URINE CULTURE: Colony Count: 15000

## 2013-09-25 IMAGING — CR DG LUMBAR SPINE COMPLETE 4+V
5 series · 5 of 5 positions shown · non-contrast
Comparison: None.

CLINICAL DATA: Injury, pain

LUMBAR SPINE - COMPLETE 4+ VIEW

[AP]
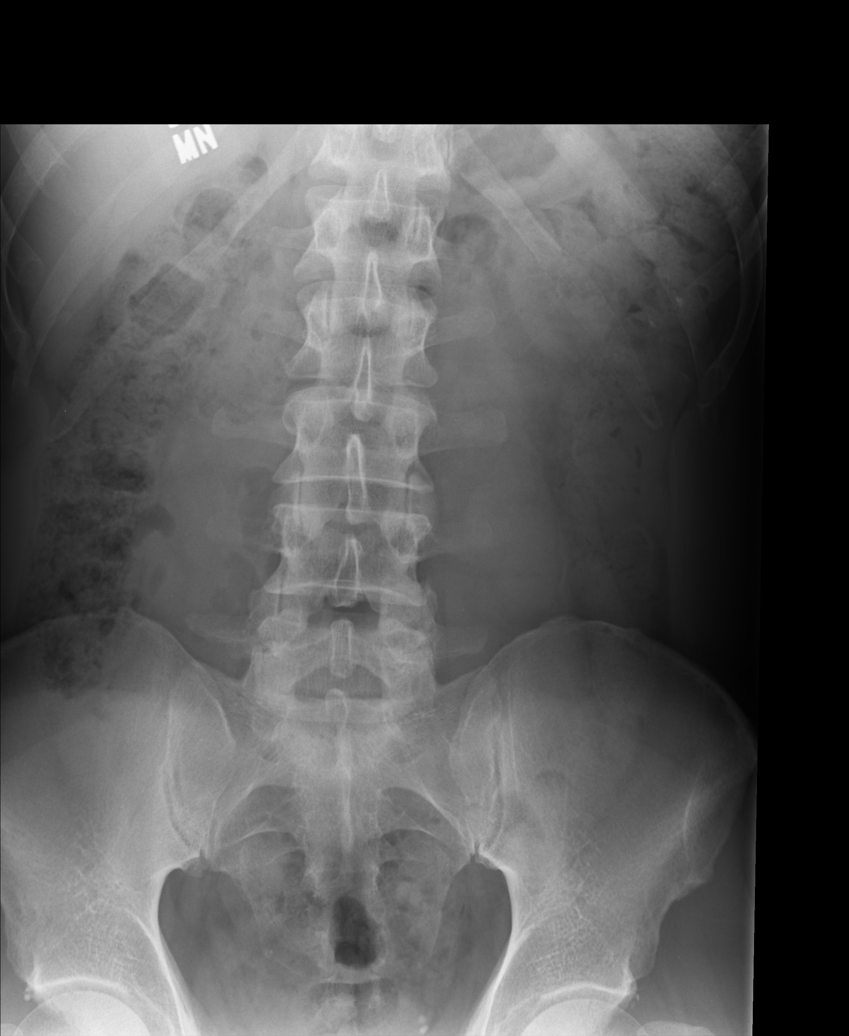

[rpo]
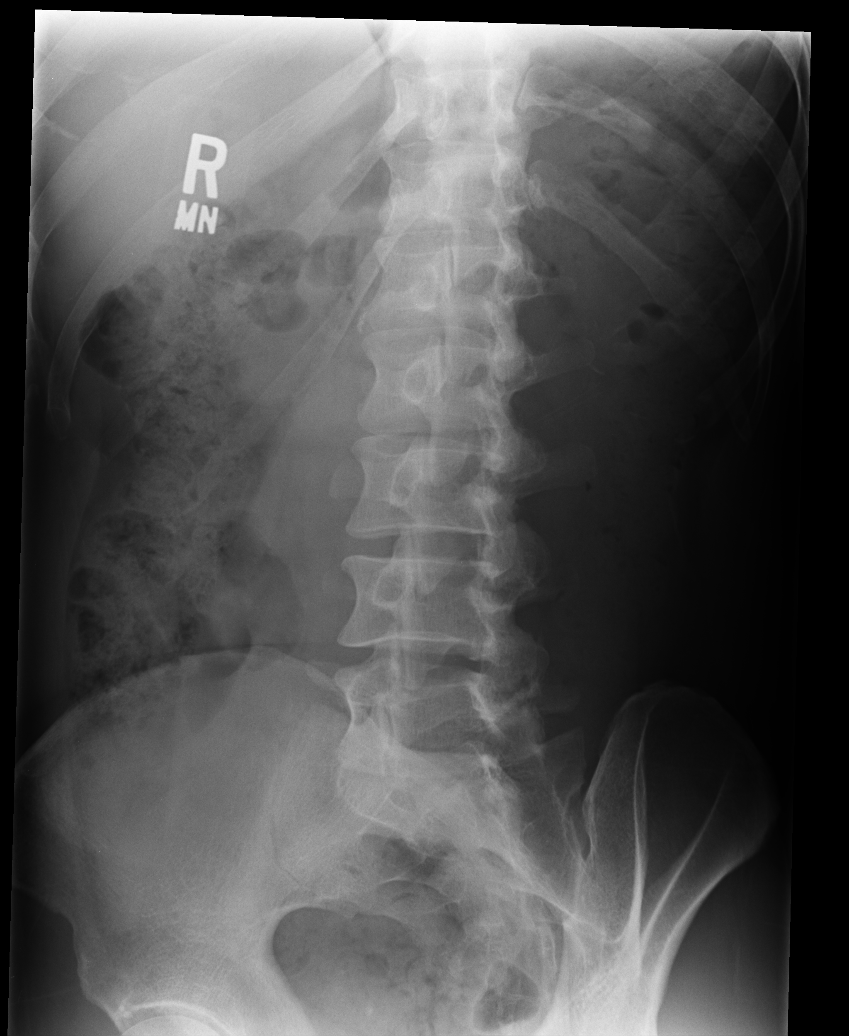

[lpo]
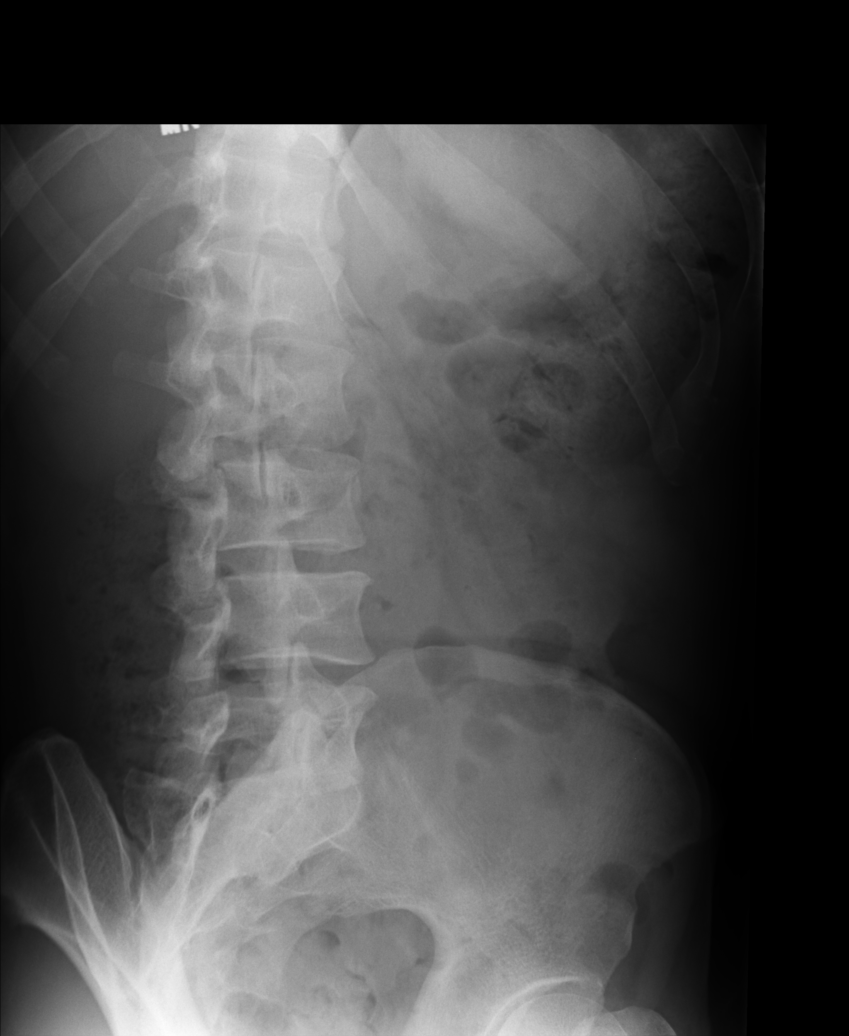

[lateral]
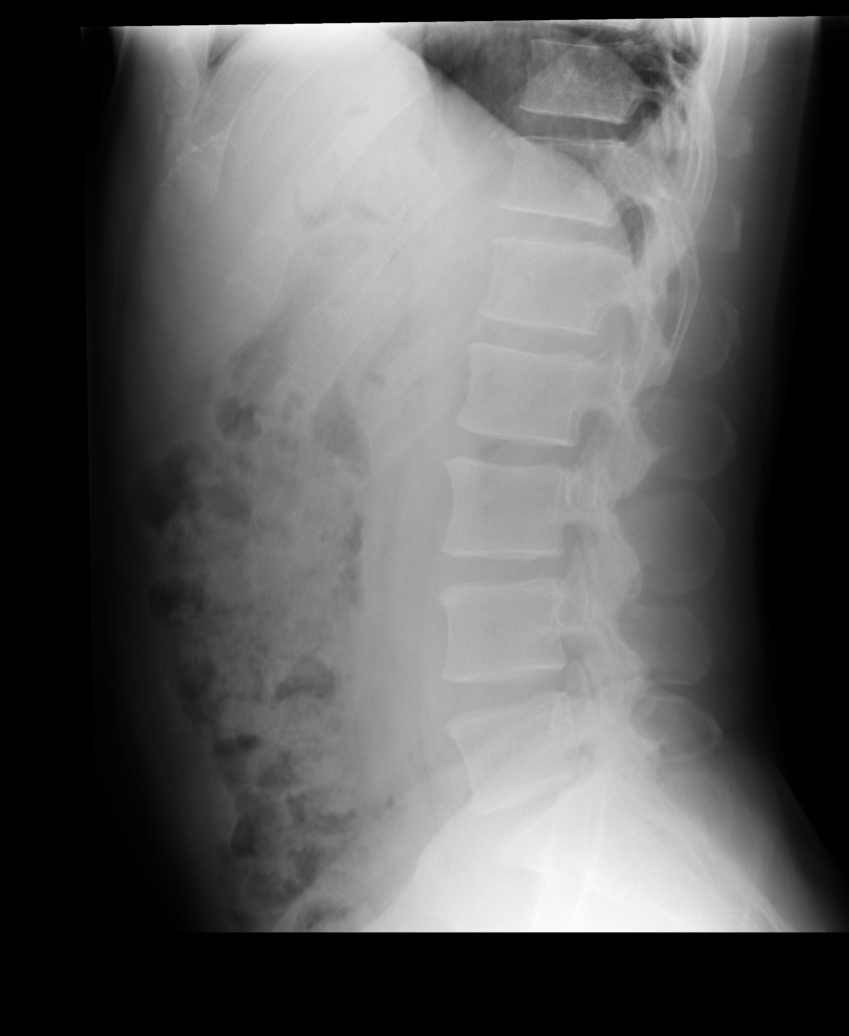

[l5 s1]
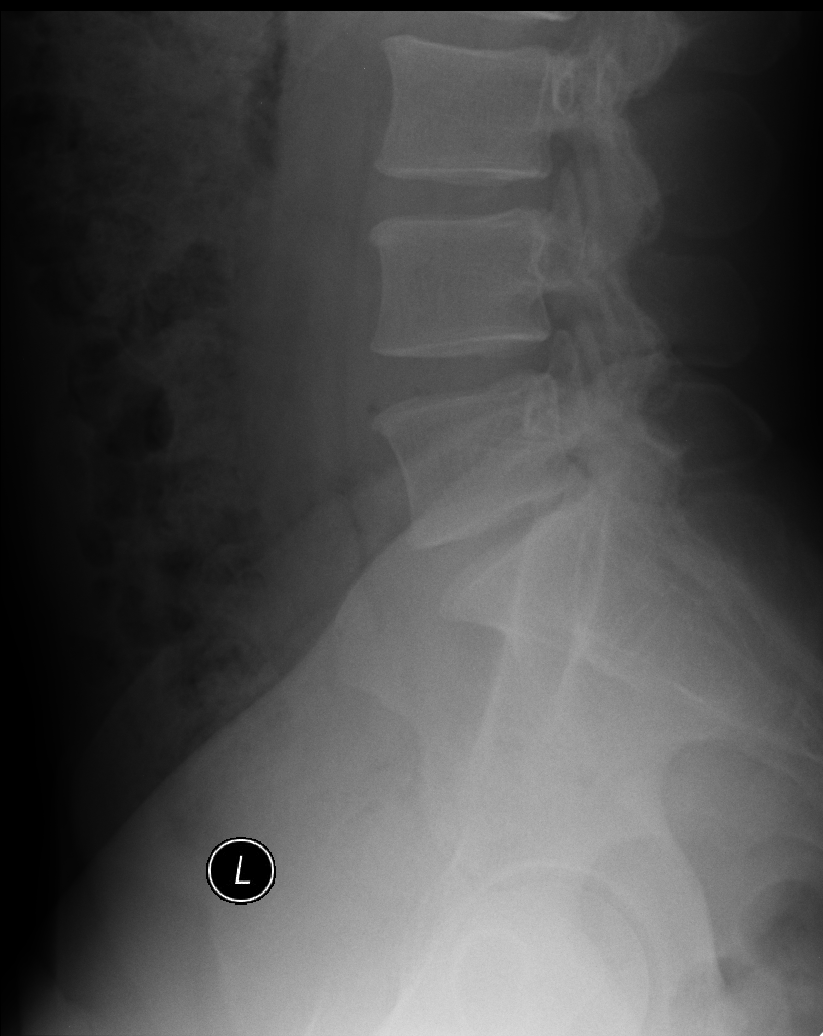

[5 of 5 positions shown; findings below may reference images not displayed]

FINDINGS: Normal lumbar spine alignment.  No compression fracture,
wedge shaped deformity or focal kyphosis.  Slight disc space
narrowing at L5 S1 compatible with minor degenerative disc disease.
Facets aligned.  Negative for pars defects.  Normal pedicles and SI
joints.  Nonobstructive bowel gas pattern.
IMPRESSION: No acute finding.

## 2014-05-09 ENCOUNTER — Emergency Department (HOSPITAL_COMMUNITY)
Admission: EM | Admit: 2014-05-09 | Discharge: 2014-05-09 | Disposition: A | Discharge status: Home / Self Care | Payer: 59 | Source: Home / Self Care | Attending: Family Medicine | Admitting: Family Medicine

## 2014-05-09 ENCOUNTER — Encounter (HOSPITAL_COMMUNITY): Payer: Self-pay | Admitting: Emergency Medicine

## 2014-05-09 DIAGNOSIS — H04309 Unspecified dacryocystitis of unspecified lacrimal passage: Secondary | ICD-10-CM

## 2014-05-09 DIAGNOSIS — H04301 Unspecified dacryocystitis of right lacrimal passage: Secondary | ICD-10-CM

## 2014-05-09 MED ORDER — TOBRAMYCIN 0.3 % OP SOLN: 1.0000 [drp] | Freq: Four times a day (QID) | OPHTHALMIC | Status: DC

## 2014-05-09 MED ORDER — CEPHALEXIN 500 MG PO CAPS: 500.0000 mg | ORAL_CAPSULE | Freq: Three times a day (TID) | ORAL | Status: DC

## 2014-05-09 NOTE — ED Notes (Signed)
Patient complains of stye to right upper eye lid Has been there for a couple of days

## 2014-05-09 NOTE — ED Provider Notes (Signed)
CSN: [id number]     Arrival date & time 05/09/14  1730 History   First MD Initiated Contact with Patient 05/09/14 1811     Chief Complaint  Patient presents with  . Stye   (Consider location/radiation/quality/duration/timing/severity/associated sxs/prior Treatment) Patient is a 48 y.o. male presenting with eye problem. The history is provided by the patient.  Eye Problem Location:  R eye Quality:  Sharp Severity:  Moderate Onset quality:  Gradual Progression:  Worsening Chronicity:  New Worsened by:  Nothing tried Ineffective treatments:  None tried Associated symptoms: discharge, redness and swelling   Associated symptoms: no blurred vision, no decreased vision, no double vision, no foreign body sensation and no photophobia     Past Medical History  Diagnosis Date  . Ulcerative colitis   . Headache(784.0)     "occas"  . Chronic low back pain    Past Surgical History  Procedure Laterality Date  . Sting ray  infection    . Lumbar laminectomy/decompression microdiscectomy Left 10/10/2012    Procedure: LUMBAR LAMINECTOMY/DECOMPRESSION MICRODISCECTOMY 1 LEVEL;  Surgeon: Temple Pacini, MD;  Location: [facility];  Service: Neurosurgery;  Laterality: Left;  Left Lumbar Five-Sacral One Microdiskectomy   Family History  Problem Relation Age of Onset  . Diabetes Father    History  Substance Use Topics  . Smoking status: Never Smoker   . Smokeless tobacco: Not on file  . Alcohol Use: No    Review of Systems  Constitutional: Negative.   HENT: Negative.   Eyes: Positive for pain, discharge and redness. Negative for blurred vision, double vision, photophobia and visual disturbance.    Allergies  Review of patient's allergies indicates no known allergies.  Home Medications   Prior to Admission medications   Medication Sig Start Date End Date Taking? Authorizing Provider  Aspirin-Acetaminophen-Caffeine (GOODYS EXTRA STRENGTH PO) Take 1 packet by mouth 2 (two) times daily as  needed (for pain).    Historical Provider, MD  azaTHIOprine (IMURAN) 50 MG tablet Take 150 mg by mouth daily.    Historical Provider, MD  cephALEXin (KEFLEX) 500 MG capsule Take 1 capsule (500 mg total) by mouth 3 (three) times daily. Take all of medicine and drink lots of fluids 05/09/14   Linna Hoff, MD  ibuprofen (ADVIL,MOTRIN) 200 MG tablet Take 400 mg by mouth 2 (two) times daily as needed for pain.    Historical Provider, MD  mesalamine (ASACOL) 400 MG EC tablet Take 800 mg by mouth 2 (two) times daily. For crohns    Historical Provider, MD  Multiple Vitamin (MULTIVITAMIN) capsule Take 1 capsule by mouth daily.    Historical Provider, MD  tobramycin (TOBREX) 0.3 % ophthalmic solution Place 1 drop into the right eye every 6 (six) hours. 05/09/14   Linna Hoff, MD   BP 144/76  Pulse 73  Temp(Src) 98.4 F (36.9 C) (Oral)  Resp 16  SpO2 100% Physical Exam  Nursing note and vitals reviewed. Constitutional: He appears well-developed and well-nourished. No distress.  HENT:  Right Ear: External ear normal.  Left Ear: External ear normal.  Mouth/Throat: Oropharynx is clear and moist.  Eyes: Conjunctivae and EOM are normal. Pupils are equal, round, and reactive to light. Right eye exhibits no discharge.    Neck: Normal range of motion. Neck supple.  Lymphadenopathy:    He has no cervical adenopathy.  Skin: Skin is warm and dry.    ED Course  Procedures (including critical care time) Labs Review Labs Reviewed -  No data to display  Imaging Review No results found.   MDM   1. Dacrocystitis, right        Linna Hoff, MD 05/09/14 [phone]

## 2014-05-09 NOTE — Discharge Instructions (Signed)
Warm soak to eye before using eye drop, take medicine as prescribed, see eye doctor on fri as discussed.

## 2014-06-27 ENCOUNTER — Encounter (HOSPITAL_COMMUNITY)
Admission: EM | Disposition: A | Discharge status: Home / Self Care | Payer: Self-pay | Source: Home / Self Care | Attending: Emergency Medicine

## 2014-06-27 ENCOUNTER — Encounter (HOSPITAL_COMMUNITY): Payer: Self-pay | Admitting: *Deleted

## 2014-06-27 ENCOUNTER — Emergency Department (HOSPITAL_COMMUNITY): Payer: Commercial Managed Care - PPO | Admitting: Anesthesiology

## 2014-06-27 ENCOUNTER — Ambulatory Visit (HOSPITAL_COMMUNITY)
Admission: EM | Admit: 2014-06-27 | Discharge: 2014-06-27 | Disposition: A | Discharge status: Home / Self Care | Payer: Commercial Managed Care - PPO | Attending: Emergency Medicine | Admitting: Emergency Medicine

## 2014-06-27 DIAGNOSIS — L089 Local infection of the skin and subcutaneous tissue, unspecified: Secondary | ICD-10-CM | POA: Diagnosis present

## 2014-06-27 DIAGNOSIS — L02512 Cutaneous abscess of left hand: Secondary | ICD-10-CM | POA: Diagnosis not present

## 2014-06-27 DIAGNOSIS — M869 Osteomyelitis, unspecified: Secondary | ICD-10-CM

## 2014-06-27 HISTORY — PX: I & D EXTREMITY: SHX5045

## 2014-06-27 LAB — I-STAT CHEM 8, ED
BUN: 11 mg/dL (ref 6–23)
CALCIUM ION: 1.13 mmol/L (ref 1.12–1.23)
CHLORIDE: 99 meq/L (ref 96–112)
Creatinine, Ser: 1.4 mg/dL — ABNORMAL HIGH (ref 0.50–1.35)
GLUCOSE: 90 mg/dL (ref 70–99)
HEMATOCRIT: 51 % (ref 39.0–52.0)
Hemoglobin: 17.3 g/dL — ABNORMAL HIGH (ref 13.0–17.0)
Potassium: 3.7 mEq/L (ref 3.7–5.3)
Sodium: 139 mEq/L (ref 137–147)
TCO2: 24 mmol/L (ref 0–100)

## 2014-06-27 SURGERY — IRRIGATION AND DEBRIDEMENT EXTREMITY
Anesthesia: General | Site: Finger | Laterality: Left

## 2014-06-27 MED ORDER — HYDROMORPHONE HCL 1 MG/ML IJ SOLN
INTRAMUSCULAR | Status: AC
  Filled 2014-06-27: qty 1

## 2014-06-27 MED ORDER — LACTATED RINGERS IV SOLN
INTRAVENOUS | Status: DC
  Administered 2014-06-27 (×2): via INTRAVENOUS

## 2014-06-27 MED ORDER — PROPOFOL 10 MG/ML IV BOLUS
INTRAVENOUS | Status: DC | PRN
  Administered 2014-06-27: 200 mg via INTRAVENOUS

## 2014-06-27 MED ORDER — PROPOFOL 10 MG/ML IV BOLUS
INTRAVENOUS | Status: AC
  Filled 2014-06-27: qty 20

## 2014-06-27 MED ORDER — CEFAZOLIN SODIUM-DEXTROSE 2-3 GM-% IV SOLR
2.0000 g | INTRAVENOUS | Status: AC
  Administered 2014-06-27: 2 g via INTRAVENOUS

## 2014-06-27 MED ORDER — OXYCODONE-ACETAMINOPHEN 5-325 MG PO TABS: 1.0000 | ORAL_TABLET | ORAL | Status: DC | PRN

## 2014-06-27 MED ORDER — PROMETHAZINE HCL 25 MG/ML IJ SOLN
INTRAMUSCULAR | Status: AC
  Filled 2014-06-27: qty 1

## 2014-06-27 MED ORDER — OXYCODONE HCL 5 MG PO TABS
ORAL_TABLET | ORAL | Status: AC
  Filled 2014-06-27: qty 1

## 2014-06-27 MED ORDER — FENTANYL CITRATE 0.05 MG/ML IJ SOLN
INTRAMUSCULAR | Status: DC | PRN
  Administered 2014-06-27 (×2): 50 ug via INTRAVENOUS

## 2014-06-27 MED ORDER — ROCURONIUM BROMIDE 50 MG/5ML IV SOLN
INTRAVENOUS | Status: AC
  Filled 2014-06-27: qty 1

## 2014-06-27 MED ORDER — ONDANSETRON HCL 4 MG/2ML IJ SOLN
INTRAMUSCULAR | Status: DC | PRN
  Administered 2014-06-27: 4 mg via INTRAVENOUS

## 2014-06-27 MED ORDER — OXYCODONE HCL 5 MG/5ML PO SOLN: 5.0000 mg | Freq: Once | ORAL | Status: AC | PRN

## 2014-06-27 MED ORDER — LIDOCAINE HCL (CARDIAC) 20 MG/ML IV SOLN
INTRAVENOUS | Status: DC | PRN
  Administered 2014-06-27: 50 mg via INTRAVENOUS

## 2014-06-27 MED ORDER — OXYCODONE HCL 5 MG PO TABS
5.0000 mg | ORAL_TABLET | Freq: Once | ORAL | Status: AC | PRN
  Administered 2014-06-27: 5 mg via ORAL

## 2014-06-27 MED ORDER — ONDANSETRON HCL 4 MG/2ML IJ SOLN
INTRAMUSCULAR | Status: AC
  Filled 2014-06-27: qty 2

## 2014-06-27 MED ORDER — SODIUM CHLORIDE 0.9 % IR SOLN
Status: DC | PRN
  Administered 2014-06-27 (×2): 1000 mL

## 2014-06-27 MED ORDER — CHLORHEXIDINE GLUCONATE 4 % EX LIQD
60.0000 mL | Freq: Once | CUTANEOUS | Status: DC
  Filled 2014-06-27: qty 60

## 2014-06-27 MED ORDER — LIDOCAINE HCL (CARDIAC) 20 MG/ML IV SOLN
INTRAVENOUS | Status: AC
  Filled 2014-06-27: qty 5

## 2014-06-27 MED ORDER — HYDROMORPHONE HCL 1 MG/ML IJ SOLN
0.2500 mg | INTRAMUSCULAR | Status: DC | PRN
  Administered 2014-06-27 (×4): 0.5 mg via INTRAVENOUS

## 2014-06-27 MED ORDER — EPHEDRINE SULFATE 50 MG/ML IJ SOLN
INTRAMUSCULAR | Status: DC | PRN
  Administered 2014-06-27: 10 mg via INTRAVENOUS

## 2014-06-27 MED ORDER — MIDAZOLAM HCL 2 MG/2ML IJ SOLN
INTRAMUSCULAR | Status: AC
  Filled 2014-06-27: qty 2

## 2014-06-27 MED ORDER — MIDAZOLAM HCL 5 MG/5ML IJ SOLN
INTRAMUSCULAR | Status: DC | PRN
  Administered 2014-06-27: 2 mg via INTRAVENOUS

## 2014-06-27 MED ORDER — FENTANYL CITRATE 0.05 MG/ML IJ SOLN
INTRAMUSCULAR | Status: AC
  Filled 2014-06-27: qty 5

## 2014-06-27 MED ORDER — PROMETHAZINE HCL 25 MG/ML IJ SOLN
6.2500 mg | INTRAMUSCULAR | Status: DC | PRN
  Administered 2014-06-27: 6.25 mg via INTRAVENOUS

## 2014-06-27 SURGICAL SUPPLY — 56 items
BANDAGE ELASTIC 3 VELCRO ST LF (GAUZE/BANDAGES/DRESSINGS) ×3 IMPLANT
BANDAGE ELASTIC 4 VELCRO ST LF (GAUZE/BANDAGES/DRESSINGS) ×3 IMPLANT
BNDG CMPR 9X4 STRL LF SNTH (GAUZE/BANDAGES/DRESSINGS) ×1
BNDG COHESIVE 1X5 TAN STRL LF (GAUZE/BANDAGES/DRESSINGS) ×2 IMPLANT
BNDG CONFORM 2 STRL LF (GAUZE/BANDAGES/DRESSINGS) ×2 IMPLANT
BNDG ESMARK 4X9 LF (GAUZE/BANDAGES/DRESSINGS) ×3 IMPLANT
BNDG GAUZE ELAST 4 BULKY (GAUZE/BANDAGES/DRESSINGS) ×3 IMPLANT
CORDS BIPOLAR (ELECTRODE) ×3 IMPLANT
COVER SURGICAL LIGHT HANDLE (MISCELLANEOUS) ×3 IMPLANT
CUFF TOURNIQUET SINGLE 18IN (TOURNIQUET CUFF) ×3 IMPLANT
CUFF TOURNIQUET SINGLE 24IN (TOURNIQUET CUFF) IMPLANT
DRAIN PENROSE 1/4X12 LTX STRL (WOUND CARE) IMPLANT
DRAPE SURG 17X23 STRL (DRAPES) ×3 IMPLANT
DRSG ADAPTIC 3X8 NADH LF (GAUZE/BANDAGES/DRESSINGS) ×3 IMPLANT
ELECT REM PT RETURN 9FT ADLT (ELECTROSURGICAL)
ELECTRODE REM PT RTRN 9FT ADLT (ELECTROSURGICAL) IMPLANT
GAUZE PACKING IODOFORM 1/4X15 (GAUZE/BANDAGES/DRESSINGS) ×2 IMPLANT
GAUZE SPONGE 4X4 12PLY STRL (GAUZE/BANDAGES/DRESSINGS) ×3 IMPLANT
GAUZE XEROFORM 1X8 LF (GAUZE/BANDAGES/DRESSINGS) ×3 IMPLANT
GAUZE XEROFORM 5X9 LF (GAUZE/BANDAGES/DRESSINGS) IMPLANT
GLOVE BIOGEL PI IND STRL 8.5 (GLOVE) ×1 IMPLANT
GLOVE BIOGEL PI INDICATOR 8.5 (GLOVE) ×2
GLOVE SURG ORTHO 8.0 STRL STRW (GLOVE) ×3 IMPLANT
GOWN STRL REUS W/ TWL LRG LVL3 (GOWN DISPOSABLE) ×3 IMPLANT
GOWN STRL REUS W/ TWL XL LVL3 (GOWN DISPOSABLE) ×1 IMPLANT
GOWN STRL REUS W/TWL LRG LVL3 (GOWN DISPOSABLE) ×9
GOWN STRL REUS W/TWL XL LVL3 (GOWN DISPOSABLE) ×3
HANDPIECE INTERPULSE COAX TIP (DISPOSABLE)
KIT BASIN OR (CUSTOM PROCEDURE TRAY) ×3 IMPLANT
KIT ROOM TURNOVER OR (KITS) ×3 IMPLANT
MANIFOLD NEPTUNE II (INSTRUMENTS) ×3 IMPLANT
NDL HYPO 25GX1X1/2 BEV (NEEDLE) IMPLANT
NEEDLE HYPO 25GX1X1/2 BEV (NEEDLE) IMPLANT
NS IRRIG 1000ML POUR BTL (IV SOLUTION) ×3 IMPLANT
PACK ORTHO EXTREMITY (CUSTOM PROCEDURE TRAY) ×3 IMPLANT
PAD ARMBOARD 7.5X6 YLW CONV (MISCELLANEOUS) ×6 IMPLANT
PAD CAST 4YDX4 CTTN HI CHSV (CAST SUPPLIES) ×1 IMPLANT
PADDING CAST COTTON 4X4 STRL (CAST SUPPLIES) ×3
SET HNDPC FAN SPRY TIP SCT (DISPOSABLE) IMPLANT
SOAP 2 % CHG 4 OZ (WOUND CARE) ×3 IMPLANT
SPONGE LAP 18X18 X RAY DECT (DISPOSABLE) ×3 IMPLANT
SPONGE LAP 4X18 X RAY DECT (DISPOSABLE) ×3 IMPLANT
SUCTION FRAZIER TIP 10 FR DISP (SUCTIONS) ×3 IMPLANT
SUT ETHILON 4 0 PS 2 18 (SUTURE) IMPLANT
SUT ETHILON 5 0 P 3 18 (SUTURE) ×2
SUT NYLON ETHILON 5-0 P-3 1X18 (SUTURE) ×1 IMPLANT
SUT PROLENE 4 0 PS 2 18 (SUTURE) ×2 IMPLANT
SYR CONTROL 10ML LL (SYRINGE) IMPLANT
TOWEL OR 17X24 6PK STRL BLUE (TOWEL DISPOSABLE) ×3 IMPLANT
TOWEL OR 17X26 10 PK STRL BLUE (TOWEL DISPOSABLE) ×3 IMPLANT
TUBE ANAEROBIC SPECIMEN COL (MISCELLANEOUS) IMPLANT
TUBE CONNECTING 12'X1/4 (SUCTIONS) ×1
TUBE CONNECTING 12X1/4 (SUCTIONS) ×2 IMPLANT
UNDERPAD 30X30 INCONTINENT (UNDERPADS AND DIAPERS) ×3 IMPLANT
WATER STERILE IRR 1000ML POUR (IV SOLUTION) ×3 IMPLANT
YANKAUER SUCT BULB TIP NO VENT (SUCTIONS) ×3 IMPLANT

## 2014-06-27 NOTE — ED Notes (Signed)
Reports having a bump to left ring finger since Friday, had increase in swelling to finger and went to ucc on Sunday and has been on antibiotics with no relief. Redness and swelling noted to finger.

## 2014-06-27 NOTE — Discharge Instructions (Signed)
KEEP BANDAGE CLEAN AND DRY CALL OFFICE FOR F/U APPT [phone] IN 2 DAYS ALSO CALL FOR THERAPY APPOINTMENT IN 2 DAYS [phone] EXT [phone] KEEP HAND ELEVATED ABOVE HEART OK TO APPLY ICE TO OPERATIVE AREA CONTACT OFFICE IF ANY WORSENING PAIN OR CONCERNS.

## 2014-06-27 NOTE — H&P (Signed)
Joseph Gibson is an [age] y.o. male.   Chief Complaint: LEFT RING FINGER PAIN AND SWELLING HPI: PT WITH SEVERAL DAY  HISTORY OF PAIN AND SWELLING IN LEFT RING FINGER PT WITH WORSENING REDNESS AND DRAINAGE PRESENTED TO ED TODAY WITH WORSENING INFECTION  Past Medical History  Diagnosis Date  . Ulcerative colitis   . Headache(784.0)     "occas"  . Chronic low back pain     Past Surgical History  Procedure Laterality Date  . Sting ray  infection    . Lumbar laminectomy/decompression microdiscectomy Left 10/10/2012    Procedure: LUMBAR LAMINECTOMY/DECOMPRESSION MICRODISCECTOMY 1 LEVEL;  Surgeon: Temple PaciniHenry A Pool, MD;  Location: [facility];  Service: Neurosurgery;  Laterality: Left;  Left Lumbar Five-Sacral One Microdiskectomy    Family History  Problem Relation Age of Onset  . Diabetes Father    Social History:  reports that he has never smoked. He does not have any smokeless tobacco history on file. He reports that he does not drink alcohol or use illicit drugs.  Allergies: No Known Allergies  Medications Prior to Admission  Medication Sig Dispense Refill  . Aspirin-Acetaminophen-Caffeine (GOODYS EXTRA STRENGTH PO) Take 1 packet by mouth 2 (two) times daily as needed (for pain).    Marland Kitchen. azaTHIOprine (IMURAN) 50 MG tablet Take 150 mg by mouth daily.    Marland Kitchen. doxycycline (MONODOX) 100 MG capsule Take 100 mg by mouth 2 (two) times daily.    Marland Kitchen. ibuprofen (ADVIL,MOTRIN) 200 MG tablet Take 400 mg by mouth 2 (two) times daily as needed for pain.    . mesalamine (ASACOL) 400 MG EC tablet Take 800 mg by mouth 2 (two) times daily. For crohns    . mupirocin ointment (BACTROBAN) 2 % Apply 1 application topically 2 (two) times daily as needed.    . sulfamethoxazole-trimethoprim (BACTRIM DS,SEPTRA DS) 800-160 MG per tablet Take 1 tablet by mouth 2 (two) times daily.    Marland Kitchen. testosterone cypionate (DEPOTESTOTERONE CYPIONATE) 200 MG/ML injection Inject 1 mL into the muscle every 7 (seven) days.  1  . cephALEXin  (KEFLEX) 500 MG capsule Take 1 capsule (500 mg total) by mouth 3 (three) times daily. Take all of medicine and drink lots of fluids 21 capsule 0  . tobramycin (TOBREX) 0.3 % ophthalmic solution Place 1 drop into the right eye every 6 (six) hours. 5 mL 0    Results for orders placed or performed during the hospital encounter of 06/27/14 (from the past 48 hour(s))  I-Stat Chem 8, ED     Status: Abnormal   Collection Time: 06/27/14  5:27 PM  Result Value Ref Range   Sodium 139 137 - 147 mEq/L   Potassium 3.7 3.7 - 5.3 mEq/L   Chloride 99 96 - 112 mEq/L   BUN 11 6 - 23 mg/dL   Creatinine, Ser 1.611.40 (H) 0.50 - 1.35 mg/dL   Glucose, Bld 90 70 - 99 mg/dL   Calcium, Ion 0.961.13 0.451.12 - 1.23 mmol/L   TCO2 24 0 - 100 mmol/L   Hemoglobin 17.3 (H) 13.0 - 17.0 g/dL   HCT 40.951.0 81.139.0 - 91.452.0 %   No results found.  ROS NO RECENT ILLNESSES OR HOSPITALIZATIONS  Blood pressure 135/78, pulse 80, temperature 97.9 F (36.6 C), temperature source Oral, resp. rate 16, SpO2 98 %. Physical Exam  General Appearance:  Alert, cooperative, no distress, appears stated age  Head:  Normocephalic, without obvious abnormality, atraumatic  Eyes:  Pupils equal, conjunctiva/corneas clear,  Throat: Lips, mucosa, and tongue normal; teeth and gums normal  Neck: No visible masses     Lungs:   respirations unlabored  Chest Wall:  No tenderness or deformity  Heart:  Regular rate and rhythm,  Abdomen:   Soft, non-tender,         Extremities: PHOTO IN ER RECORD DOCUMENTS FINGER INFECTION OTHER FINGERS NOT INVOLVED FINGERS WARM WELL PERFUSED  Pulses: 2+ and symmetric  Skin: Skin color, texture, turgor normal, no rashes or lesions     Neurologic: Normal    Assessment/Plan LEFT RING FINGER INFECTION  LEFT RING FINGER INCISION AND DRAINAGE AND DEBRIDEMENT  R/B/A DISCUSSED WITH PT IN OFFICE.  PT VOICED UNDERSTANDING OF PLAN CONSENT SIGNED DAY OF SURGERY PT SEEN AND EXAMINED PRIOR TO OPERATIVE PROCEDURE/DAY OF  SURGERY SITE MARKED. QUESTIONS ANSWERED WILL GO HOME FOLLOWING SURGERY  WE ARE PLANNING SURGERY FOR YOUR UPPER EXTREMITY. THE RISKS AND BENEFITS OF SURGERY INCLUDE BUT NOT LIMITED TO BLEEDING INFECTION, DAMAGE TO NEARBY NERVES ARTERIES TENDONS, FAILURE OF SURGERY TO ACCOMPLISH ITS INTENDED GOALS, PERSISTENT SYMPTOMS AND NEED FOR FURTHER SURGICAL INTERVENTION. WITH THIS IN MIND WE WILL PROCEED. I HAVE DISCUSSED WITH THE PATIENT THE PRE AND POSTOPERATIVE REGIMEN AND THE DOS AND DON'TS. PT VOICED UNDERSTANDING AND INFORMED CONSENT SIGNED.  Sharma CovertORTMANN,Cannon Arreola W 06/27/2014, 6:36 PM

## 2014-06-27 NOTE — Anesthesia Preprocedure Evaluation (Signed)
Anesthesia Evaluation  Patient identified by MRN, date of birth, ID band Patient awake  General Assessment Comment:Infected hand and finger  Reviewed: Allergy & Precautions, H&P , NPO status , Patient's Chart, lab work & pertinent test results  History of Anesthesia Complications Negative for: history of anesthetic complications  Airway Mallampati: II   Neck ROM: Full    Dental   Pulmonary  breath sounds clear to auscultation        Cardiovascular Rhythm:Regular Rate:Normal     Neuro/Psych    GI/Hepatic PUD,   Endo/Other    Renal/GU      Musculoskeletal   Abdominal   Peds  Hematology   Anesthesia Other Findings   Reproductive/Obstetrics                             Anesthesia Physical Anesthesia Plan  ASA: I and emergent  Anesthesia Plan: General   Post-op Pain Management:    Induction: Intravenous  Airway Management Planned: LMA  Additional Equipment:   Intra-op Plan:   Post-operative Plan: Extubation in OR  Informed Consent: I have reviewed the patients History and Physical, chart, labs and discussed the procedure including the risks, benefits and alternatives for the proposed anesthesia with the patient or authorized representative who has indicated his/her understanding and acceptance.   Dental advisory given  Plan Discussed with: CRNA  Anesthesia Plan Comments:         Anesthesia Quick Evaluation

## 2014-06-27 NOTE — ED Provider Notes (Signed)
CSN: [id number]     Arrival date & time 06/27/14  1338 History   First MD Initiated Contact with Patient 06/27/14 1453     Chief Complaint  Patient presents with  . Finger Injury     (Consider location/radiation/quality/duration/timing/severity/associated sxs/prior Treatment) Patient is a 48 y.o. male presenting with hand pain.  Hand Pain This is a new problem. Episode onset: 5 days ago. The problem occurs constantly. The problem has been gradually worsening. Pertinent negatives include no chest pain, no abdominal pain, no headaches and no shortness of breath. Nothing aggravates the symptoms. Nothing relieves the symptoms. Treatments tried: bactrim and doxy.    Past Medical History  Diagnosis Date  . Ulcerative colitis   . Headache(784.0)     "occas"  . Chronic low back pain    Past Surgical History  Procedure Laterality Date  . Sting ray  infection    . Lumbar laminectomy/decompression microdiscectomy Left 10/10/2012    Procedure: LUMBAR LAMINECTOMY/DECOMPRESSION MICRODISCECTOMY 1 LEVEL;  Surgeon: Temple PaciniHenry A Pool, MD;  Location: [facility];  Service: Neurosurgery;  Laterality: Left;  Left Lumbar Five-Sacral One Microdiskectomy   Family History  Problem Relation Age of Onset  . Diabetes Father    History  Substance Use Topics  . Smoking status: Never Smoker   . Smokeless tobacco: Not on file  . Alcohol Use: No    Review of Systems  Respiratory: Negative for shortness of breath.   Cardiovascular: Negative for chest pain.  Gastrointestinal: Negative for abdominal pain.  Neurological: Negative for headaches.  All other systems reviewed and are negative.     Allergies  Review of patient's allergies indicates no known allergies.  Home Medications   Prior to Admission medications   Medication Sig Start Date End Date Taking? Authorizing Provider  Aspirin-Acetaminophen-Caffeine (GOODYS EXTRA STRENGTH PO) Take 1 packet by mouth 2 (two) times daily as needed (for pain).    Yes Historical Provider, MD  azaTHIOprine (IMURAN) 50 MG tablet Take 150 mg by mouth daily.   Yes Historical Provider, MD  doxycycline (MONODOX) 100 MG capsule Take 100 mg by mouth 2 (two) times daily. 06/24/14  Yes Historical Provider, MD  ibuprofen (ADVIL,MOTRIN) 200 MG tablet Take 400 mg by mouth 2 (two) times daily as needed for pain.   Yes Historical Provider, MD  mesalamine (ASACOL) 400 MG EC tablet Take 800 mg by mouth 2 (two) times daily. For crohns   Yes Historical Provider, MD  mupirocin ointment (BACTROBAN) 2 % Apply 1 application topically 2 (two) times daily as needed. 06/24/14  Yes Historical Provider, MD  sulfamethoxazole-trimethoprim (BACTRIM DS,SEPTRA DS) 800-160 MG per tablet Take 1 tablet by mouth 2 (two) times daily. 06/24/14  Yes Historical Provider, MD  testosterone cypionate (DEPOTESTOTERONE CYPIONATE) 200 MG/ML injection Inject 1 mL into the muscle every 7 (seven) days. 06/06/14  Yes Historical Provider, MD  cephALEXin (KEFLEX) 500 MG capsule Take 1 capsule (500 mg total) by mouth 3 (three) times daily. Take all of medicine and drink lots of fluids 05/09/14   Linna HoffJames D Kindl, MD  oxyCODONE-acetaminophen (ROXICET) 5-325 MG per tablet Take 1 tablet by mouth every 4 (four) hours as needed for severe pain. 06/27/14   Sharma CovertFred W Ortmann, MD  tobramycin (TOBREX) 0.3 % ophthalmic solution Place 1 drop into the right eye every 6 (six) hours. 05/09/14   Linna HoffJames D Kindl, MD   BP 158/96 mmHg  Pulse 98  Temp(Src) 97.7 F (36.5 C) (Oral)  Resp 23  SpO2 97% Physical  Exam  Constitutional: He is oriented to person, place, and time. He appears well-developed and well-nourished.  HENT:  Head: Normocephalic and atraumatic.  Eyes: Conjunctivae and EOM are normal.  Neck: Normal range of motion. Neck supple.  Cardiovascular: Normal rate, regular rhythm and normal heart sounds.   Pulmonary/Chest: Effort normal and breath sounds normal. No respiratory distress.  Abdominal: He exhibits no distension.  There is no tenderness. There is no rebound and no guarding.  Musculoskeletal: Normal range of motion.  Neurological: He is alert and oriented to person, place, and time.  Skin: Skin is warm and dry.  Erythematous, warm L 4th finger as seen in photo  Vitals reviewed.   ED Course  Procedures (including critical care time) Labs Review Labs Reviewed  I-STAT CHEM 8, ED - Abnormal; Notable for the following:    Creatinine, Ser 1.40 (*)    Hemoglobin 17.3 (*)    All other components within normal limits  ANAEROBIC CULTURE  CULTURE, ROUTINE-ABSCESS  SURGICAL PATHOLOGY    Imaging Review No results found.   EKG Interpretation None        MDM   Final diagnoses:  Infection of phalanx of hand    48 y.o. male with pertinent PMH of UC presents with finger infection as described above.  No systemic symptoms.  Consulted hand.  Pt taken to OR.    1. Infection of phalanx of hand       Mirian MoMatthew Gentry, MD 06/28/14 0006

## 2014-06-27 NOTE — Brief Op Note (Signed)
06/27/2014  6:38 PM  PATIENT:  Joseph Gibson  48 y.o. male  PRE-OPERATIVE DIAGNOSIS:  Infected Left Ring Finger  POST-OPERATIVE DIAGNOSIS:  * No post-op diagnosis entered *  PROCEDURE:  Procedure(s): IRRIGATION AND DEBRIDEMENT EXTREMITY (Left)  SURGEON:  Surgeon(s) and Role:    * Sharma CovertFred W Ulanda Tackett, MD - Primary  PHYSICIAN ASSISTANT:   ASSISTANTS: none   ANESTHESIA:   general  EBL:     BLOOD ADMINISTERED:none  DRAINS: none   LOCAL MEDICATIONS USED:  NONE  SPECIMEN:  No Specimen  DISPOSITION OF SPECIMEN:  N/A  COUNTS:  YES  TOURNIQUET:    DICTATION: .Other Dictation: Dictation Number `[id number]  PLAN OF CARE: Discharge to home after PACU  PATIENT DISPOSITION:  PACU - hemodynamically stable.   Delay start of Pharmacological VTE agent (>24hrs) due to surgical blood loss or risk of bleeding: not applicable

## 2014-06-27 NOTE — ED Notes (Signed)
Hand Surgeon at the bedside.  

## 2014-06-27 NOTE — Anesthesia Postprocedure Evaluation (Signed)
  Anesthesia Post-op Note  Patient: Joseph Gibson  Procedure(s) Performed: Procedure(s): Incision and Drainage of left ring finger (Left)  Patient Location: PACU  Anesthesia Type:General  Level of Consciousness: awake and alert   Airway and Oxygen Therapy: Patient Spontanous Breathing  Post-op Pain: mild  Post-op Assessment: Post-op Vital signs reviewed  Post-op Vital Signs: stable  Last Vitals:  Filed Vitals:   06/27/14 2030  BP: 158/96  Pulse: 98  Temp:   Resp: 23    Complications: No apparent anesthesia complications

## 2014-06-27 NOTE — Transfer of Care (Signed)
Immediate Anesthesia Transfer of Care Note  Patient: Joseph Gibson  Procedure(s) Performed: Procedure(s): Incision and Drainage of left ring finger (Left)  Patient Location: PACU  Anesthesia Type:General  Level of Consciousness: awake  Airway & Oxygen Therapy: Patient Spontanous Breathing and Patient connected to nasal cannula oxygen  Post-op Assessment: Report given to PACU RN and Post -op Vital signs reviewed and stable  Post vital signs: Reviewed and stable  Complications: No apparent anesthesia complications

## 2014-06-28 ENCOUNTER — Encounter (HOSPITAL_COMMUNITY): Payer: Self-pay | Admitting: Orthopedic Surgery

## 2014-06-30 LAB — CULTURE, ROUTINE-ABSCESS

## 2014-07-02 LAB — ANAEROBIC CULTURE

## 2014-09-07 NOTE — Op Note (Signed)
NAMThalia Gibson:  Spina, Ad                 ACCOUNT NO.:  [id number]  MEDICAL RECORD NO.:  [id number]  LOCATION:  [facility]                         FACILITY:  [facility]  PHYSICIAN:  Madelynn DoneFred W Navada Osterhout IV, MD  DATE OF BIRTH:  [birth year]  DATE OF PROCEDURE:  06/27/2014 DATE OF DISCHARGE:  06/27/2014                              OPERATIVE REPORT   PREOPERATIVE DIAGNOSIS:  Left ring finger and a deep space infection.  POSTOPERATIVE DIAGNOSIS:  Left ring finger and a deep space infection.  ATTENDING SURGEON:  Sharma CovertFred W. Eleftherios Dudenhoeffer IV, MD who scrubbed and present for the entire procedure.  ASSISTANT SURGEON:  None.  ANESTHESIA:  General via LMA.  SURGICAL PROCEDURES: 1. Left ring finger incision and drainage of abscess. 2. Left ring finger extensor tenosynovectomy and debridement.  SURGICAL INDICATIONS:  Mr. [name] is a right hand dominant gentleman with left ring finger pain and swelling.  The patient had worsening redness and drainage, and it was recommended that he undergo the above procedure.  Risks, benefits, and alternatives were discussed in detail with the patient, and signed informed consent was obtained.  Risks include, but not limited to bleeding, infection, damage to nearby nerves, arteries, or tendons, loss of motion of wrist and digits, incomplete relief of symptoms, and need for further surgical intervention.  DESCRIPTION OF PROCEDURE:  The patient was properly identified in the preop holding area and marked with a permanent marker made on the left ring finger to indicate correct operative site.  The patient was then brought back to the operating room, placed supine on the anesthesia room table where general anesthesia was administered.  The patient tolerated this well.  A well-padded tourniquet was then placed on the left brachium, sealed with 1000 drape.  Left upper extremity was then prepped and draped in normal sterile fashion.  Time-out was called, the correct side was identified, and  the procedure was then begun.  Attention then turned to the left ring finger.  A longitudinal incision was made directly over the abscess area.  The abscess area was then drained. Wound cultures were then taken.  Following this after incision and drainage, the thorough debridement was then carried out.  The extensor tenosynovectomy of the ring finger was then carried out in the finger extending into the dorsum of the hand.  After drainage and tenosynovectomy, the wound was then thoroughly irrigated.  The wound was then loosely reapproximated and closed over a small vessel loop. Sutures were then applied, Prolene sutures.  Adaptic dressing and sterile compressive bandage were then applied.  The patient was placed in a well-padded finger splint, extubated, and taken to recovery room in good condition.  POSTPROCEDURE PLAN:  The patient was admitted overnight for IV antibiotics and the patient will be discharged to home, seen back in the office for close followup and begin an outpatient local wound care. Follow up with wound cultures and oral antibiotics.     Madelynn DoneFred W Makih Stefanko IV, MD     FWO/MEDQ  D:  09/07/2014  T:  09/07/2014  Job:  [id number]

## 2016-08-31 ENCOUNTER — Encounter (HOSPITAL_COMMUNITY): Payer: Self-pay | Admitting: Emergency Medicine

## 2016-08-31 ENCOUNTER — Ambulatory Visit (HOSPITAL_COMMUNITY)
Admission: EM | Admit: 2016-08-31 | Discharge: 2016-08-31 | Disposition: A | Discharge status: Home / Self Care | Payer: BC Managed Care – PPO | Attending: Family Medicine | Admitting: Family Medicine

## 2016-08-31 DIAGNOSIS — N342 Other urethritis: Secondary | ICD-10-CM | POA: Diagnosis not present

## 2016-08-31 DIAGNOSIS — R300 Dysuria: Secondary | ICD-10-CM

## 2016-08-31 DIAGNOSIS — Z9889 Other specified postprocedural states: Secondary | ICD-10-CM | POA: Insufficient documentation

## 2016-08-31 DIAGNOSIS — Z79899 Other long term (current) drug therapy: Secondary | ICD-10-CM | POA: Insufficient documentation

## 2016-08-31 LAB — POCT URINALYSIS DIP (DEVICE)
BILIRUBIN URINE: NEGATIVE
Glucose, UA: NEGATIVE mg/dL
LEUKOCYTES UA: NEGATIVE
Nitrite: NEGATIVE
Protein, ur: NEGATIVE mg/dL
Specific Gravity, Urine: >=1.03 (ref 1.005–1.030)
UROBILINOGEN UA: 1 mg/dL (ref 0.0–1.0)
pH: 6 (ref 5.0–8.0)

## 2016-08-31 MED ORDER — AZITHROMYCIN 250 MG PO TABS
1000.0000 mg | ORAL_TABLET | Freq: Once | ORAL | Status: AC
  Administered 2016-08-31: 1000 mg via ORAL

## 2016-08-31 MED ORDER — STERILE WATER FOR INJECTION IJ SOLN
INTRAMUSCULAR | Status: AC
  Filled 2016-08-31: qty 10

## 2016-08-31 MED ORDER — PHENAZOPYRIDINE HCL 200 MG PO TABS: 200.0000 mg | ORAL_TABLET | Freq: Three times a day (TID) | ORAL | 0 refills | Status: AC

## 2016-08-31 MED ORDER — CEFTRIAXONE SODIUM 250 MG IJ SOLR
250.0000 mg | Freq: Once | INTRAMUSCULAR | Status: AC
  Administered 2016-08-31: 250 mg via INTRAMUSCULAR

## 2016-08-31 MED ORDER — AZITHROMYCIN 250 MG PO TABS
ORAL_TABLET | ORAL | Status: AC
  Filled 2016-08-31: qty 4

## 2016-08-31 MED ORDER — CEFTRIAXONE SODIUM 250 MG IJ SOLR
INTRAMUSCULAR | Status: AC
  Filled 2016-08-31: qty 250

## 2016-08-31 MED ORDER — LIDOCAINE HCL (PF) 1 % IJ SOLN
INTRAMUSCULAR | Status: AC
  Filled 2016-08-31: qty 2

## 2016-08-31 NOTE — ED Notes (Signed)
Dirty and clean urine collected. 

## 2016-08-31 NOTE — Discharge Instructions (Signed)
Call back number [phone]

## 2016-08-31 NOTE — ED Triage Notes (Signed)
[facility] a [facility] and some mild abdominal pain for 4 days. The patient denied any penile discharge.

## 2016-08-31 NOTE — ED Provider Notes (Signed)
CSN: [id number]     Arrival date & time 08/31/16  1914 History   First MD Initiated Contact with Patient 08/31/16 2043     Chief Complaint  Patient presents with  . Dysuria   (Consider location/radiation/quality/duration/timing/severity/associated sxs/prior Treatment) Patient c/o dysuria.  He denies any urethral DC.  He has recently had unprotected sex with new partner.  He c/o urethritis.   The history is provided by the patient.  Dysuria  This is a new problem. The current episode started more than 2 days ago. The problem occurs constantly. The problem has not changed since onset.   Past Medical History:  Diagnosis Date  . Chronic low back pain   . Headache(784.0)    "occas"  . Ulcerative colitis [facility](HCC)    Past Surgical History:  Procedure Laterality Date  . I&D EXTREMITY Left 06/27/2014   Procedure: Incision and Drainage of left ring finger;  Surgeon: Sharma CovertFred W Ortmann, MD;  Location: [facility] OR;  Service: Orthopedics;  Laterality: Left;  . LUMBAR LAMINECTOMY/DECOMPRESSION MICRODISCECTOMY Left 10/10/2012   Procedure: LUMBAR LAMINECTOMY/DECOMPRESSION MICRODISCECTOMY 1 LEVEL;  Surgeon: Temple PaciniHenry A Pool, MD;  Location: [facility] OR[facility];  Service: Neurosurgery;  Laterality: Left;  Left Lumbar Five-Sacral One Microdiskectomy  . sting ray  infection     Family History  Problem Relation Age of Onset  . Diabetes Father    Social History  Substance Use Topics  . Smoking status: Never Smoker  . Smokeless tobacco: Not on file  . Alcohol use No    Review of Systems  Constitutional: Negative.   HENT: Negative.   Eyes: Negative.   Respiratory: Negative.   Cardiovascular: Negative.   Gastrointestinal: Negative.   Genitourinary: Positive for dysuria.  Musculoskeletal: Negative.   Allergic/Immunologic: Negative.   Neurological: Negative.   Hematological: Negative.   Psychiatric/Behavioral: Negative.     Allergies  Patient has no known allergies.  Home Medications   Prior to Admission  medications   Medication Sig Start Date End Date Taking? Authorizing Provider  azaTHIOprine (IMURAN) 50 MG tablet Take 150 mg by mouth daily.   Yes Historical Provider, MD  mesalamine (ASACOL) 400 MG EC tablet Take 800 mg by mouth 2 (two) times daily. For crohns   Yes Historical Provider, MD  phenazopyridine (PYRIDIUM) 200 MG tablet Take 1 tablet (200 mg total) by mouth 3 (three) times daily. 08/31/16   Deatra CanterWilliam J Oxford, FNP   Meds Ordered and Administered this Visit   Medications  cefTRIAXone (ROCEPHIN) injection 250 mg (not administered)  azithromycin (ZITHROMAX) tablet 1,000 mg (not administered)    BP 136/93 (BP Location: Right Arm)   Pulse 84   Temp 97.8 F (36.6 C) (Oral)   Resp 16   SpO2 98%  No data found.   Physical Exam  Constitutional: He appears well-developed and well-nourished.  HENT:  Head: Normocephalic and atraumatic.  Right Ear: External ear normal.  Left Ear: External ear normal.  Mouth/Throat: Oropharynx is clear and moist.  Eyes: Conjunctivae and EOM are normal. Pupils are equal, round, and reactive to light.  Neck: Normal range of motion. Neck supple.  Cardiovascular: Normal rate, regular rhythm and normal heart sounds.   Pulmonary/Chest: Effort normal and breath sounds normal.  Genitourinary: Penile tenderness present.  Nursing note and vitals reviewed.   Urgent Care Course   Clinical Course     Procedures (including critical care time)  Labs Review Labs Reviewed  POCT URINALYSIS DIP (DEVICE) - Abnormal; Notable for the following:  Result Value   Ketones, ur TRACE (*)    Hgb urine dipstick TRACE (*)    All other components within normal limits  URINE CYTOLOGY ANCILLARY ONLY    Imaging Review No results found.   Visual Acuity Review  Right Eye Distance:   Left Eye Distance:   Bilateral Distance:    Right Eye Near:   Left Eye Near:    Bilateral Near:         MDM   1. Urethritis   2. Dysuria    Rocephin 250mg   IM Azithromycin 250mg  x 4 UA dip  UA cytology GC chlamydia trich    Deatra Canter, FNP 08/31/16 2109

## 2016-09-01 LAB — URINE CYTOLOGY ANCILLARY ONLY
Chlamydia: NEGATIVE
Neisseria Gonorrhea: NEGATIVE
Trichomonas: NEGATIVE

## 2016-09-07 ENCOUNTER — Encounter (HOSPITAL_COMMUNITY): Payer: Self-pay | Admitting: Emergency Medicine

## 2016-09-07 ENCOUNTER — Ambulatory Visit (HOSPITAL_COMMUNITY)
Admission: EM | Admit: 2016-09-07 | Discharge: 2016-09-07 | Disposition: A | Discharge status: Home / Self Care | Payer: BC Managed Care – PPO | Attending: Family Medicine | Admitting: Family Medicine

## 2016-09-07 DIAGNOSIS — H109 Unspecified conjunctivitis: Secondary | ICD-10-CM

## 2016-09-07 DIAGNOSIS — N410 Acute prostatitis: Secondary | ICD-10-CM

## 2016-09-07 LAB — POCT URINALYSIS DIP (DEVICE)
BILIRUBIN URINE: NEGATIVE
Glucose, UA: NEGATIVE mg/dL
Hgb urine dipstick: NEGATIVE
KETONES UR: NEGATIVE mg/dL
LEUKOCYTES UA: NEGATIVE
NITRITE: NEGATIVE
Protein, ur: NEGATIVE mg/dL
Specific Gravity, Urine: 1.02 (ref 1.005–1.030)
Urobilinogen, UA: 0.2 mg/dL (ref 0.0–1.0)
pH: 7 (ref 5.0–8.0)

## 2016-09-07 MED ORDER — CIPROFLOXACIN HCL 500 MG PO TABS: 500.0000 mg | ORAL_TABLET | Freq: Two times a day (BID) | ORAL | 0 refills | Status: AC

## 2016-09-07 MED ORDER — TAMSULOSIN HCL 0.4 MG PO CAPS: 0.4000 mg | ORAL_CAPSULE | Freq: Every day | ORAL | 0 refills | Status: AC

## 2016-09-07 MED ORDER — ERYTHROMYCIN 5 MG/GM OP OINT: 1.0000 "application " | TOPICAL_OINTMENT | Freq: Three times a day (TID) | OPHTHALMIC | 0 refills | Status: AC

## 2016-09-07 NOTE — ED Triage Notes (Signed)
[facility] a [facility] of bilateral eye pain and itching x 3 days.  The patient would also like to discuss the results of his urine cytology test that was done 7 days ago as he is still having symptoms.

## 2016-09-07 NOTE — ED Provider Notes (Signed)
CSN: [id number]     Arrival date & time 09/07/16  1049 History   First MD Initiated Contact with Patient 09/07/16 1226     Chief Complaint  Patient presents with  . Eye Problem   (Consider location/radiation/quality/duration/timing/severity/associated sxs/prior Treatment) 51 year old male presents to clinic with chief complaint of bilateral red eyes, eyes have been red and painful for three days with itching, and crusty discharge. He denies any eye pain or change in his vision.  He also complains of dysuria. He was seen in the urgent care 7 days ago, tested for STD's and given Azithromycin and rocephin empirically. STD screen was negative, he reports no improvement in his symptoms. He complains of a burning with urination, frequency and pressure, and pain with ejaculation. He denies fever, back pain, flank pain, or abdominal pain, no nausea or systemic symptoms.   The history is provided by the patient.  Eye Problem  Associated symptoms: discharge, itching and redness   Associated symptoms: no nausea, no photophobia and no vomiting     Past Medical History:  Diagnosis Date  . Chronic low back pain   . Headache(784.0)    "occas"  . Ulcerative colitis [facility])    Past Surgical History:  Procedure Laterality Date  . I&D EXTREMITY Left 06/27/2014   Procedure: Incision and Drainage of left ring finger;  Surgeon: Sharma Covert, MD;  Location: [facility] OR;  Service: Orthopedics;  Laterality: Left;  . LUMBAR LAMINECTOMY/DECOMPRESSION MICRODISCECTOMY Left 10/10/2012   Procedure: LUMBAR LAMINECTOMY/DECOMPRESSION MICRODISCECTOMY 1 LEVEL;  Surgeon: Temple Pacini, MD;  Location: [facility];  Service: Neurosurgery;  Laterality: Left;  Left Lumbar Five-Sacral One Microdiskectomy  . sting ray  infection     Family History  Problem Relation Age of Onset  . Diabetes Father    Social History  Substance Use Topics  . Smoking status: Never Smoker  . Smokeless tobacco: Not on file  . Alcohol use No     Review of Systems  Constitutional: Negative.   HENT: Negative.   Eyes: Positive for discharge, redness and itching. Negative for photophobia, pain and visual disturbance.  Gastrointestinal: Negative for constipation, diarrhea, nausea and vomiting.  Genitourinary: Positive for difficulty urinating, dysuria, frequency and urgency. Negative for decreased urine volume, discharge, flank pain, genital sores, penile pain, penile swelling and testicular pain.  Musculoskeletal: Negative for arthralgias, back pain and myalgias.  Skin: Negative.   Neurological: Negative.     Allergies  Patient has no known allergies.  Home Medications   Prior to Admission medications   Medication Sig Start Date End Date Taking? Authorizing Provider  azaTHIOprine (IMURAN) 50 MG tablet Take 150 mg by mouth daily.   Yes Historical Provider, MD  mesalamine (ASACOL) 400 MG EC tablet Take 800 mg by mouth 2 (two) times daily. For crohns   Yes Historical Provider, MD  phenazopyridine (PYRIDIUM) 200 MG tablet Take 1 tablet (200 mg total) by mouth 3 (three) times daily. 08/31/16  Yes Deatra Canter, FNP  ciprofloxacin (CIPRO) 500 MG tablet Take 1 tablet (500 mg total) by mouth 2 (two) times daily. 09/07/16 09/21/16  Dorena Bodo, NP  erythromycin ophthalmic ointment Place 1 application into both eyes 3 (three) times daily. 09/07/16   Dorena Bodo, NP  tamsulosin (FLOMAX) 0.4 MG CAPS capsule Take 1 capsule (0.4 mg total) by mouth daily after supper. 09/07/16   Dorena Bodo, NP   Meds Ordered and Administered this Visit  Medications - No data to display  BP  129/91 (BP Location: Left Arm)   Pulse 96   Temp 97.9 F (36.6 C) (Oral)   Resp 18   SpO2 99%  No data found.   Physical Exam  Constitutional: He is oriented to person, place, and time. He appears well-developed and well-nourished. No distress.  Eyes: EOM and lids are normal. Pupils are equal, round, and reactive to light. Right eye exhibits  discharge. Left eye exhibits discharge. Right conjunctiva is injected. Left conjunctiva is injected.  Cardiovascular: Normal rate and regular rhythm.   Pulmonary/Chest: Effort normal and breath sounds normal.  Abdominal: Bowel sounds are normal.  Genitourinary: Rectum normal, testes normal and penis normal. Prostate is tender (Boggy). Circumcised.  Neurological: He is alert and oriented to person, place, and time.  Skin: Skin is warm. Capillary refill takes less than 2 seconds. He is not diaphoretic.  Psychiatric: He has a normal mood and affect.  Nursing note and vitals reviewed.   Urgent Care Course     Procedures (including critical care time)  Labs Review Labs Reviewed  POCT URINALYSIS DIP (DEVICE)    Imaging Review No results found.   Visual Acuity Review  Right Eye Distance: 20/50 Left Eye Distance: 20/40 Bilateral Distance: 20/40  Right Eye Near:   Left Eye Near:    Bilateral Near:         MDM   1. Conjunctivitis of both eyes, unspecified conjunctivitis type   2. Acute prostatitis   You most likely have prostatitis, an infection of your prostate. I have sent a prescription to your pharmacy for Cipro, take 1 tablet twice a day for 14 days. I have also sent a prescription for Flomax, take 1 tablet in the evening. This condition may need additional treatment beyond 14 days, I recommend should your symptoms persist to schedule an appointment with your primary care provider for additional evaluation. For your eyes, I have sent a prescription for erythromycin eye ointment, place one half inch in each eye, under the eye lid, three times a day. I would also recommend taking a daily non-sedating antihistamine such as Claritin or Zyrtec once a day in the morning. Should your symptoms fail to improve or worsen, follow up with your primary care provider or return to clinic.     Dorena BodoLawrence Osmond Steckman, NP 09/07/16 1301

## 2016-09-07 NOTE — Discharge Instructions (Signed)
You most likely have prostatitis, an infection of your prostate. I have sent a prescription to your pharmacy for Cipro, take 1 tablet twice a day for 14 days. I have also sent a prescription for Flomax, take 1 tablet in the evening. This condition may need additional treatment beyond 14 days, I recommend should your symptoms persist to schedule an appointment with your primary care provider for additional evaluation. For your eyes, I have sent a prescription for erythromycin eye ointment, place one half inch in each eye, under the eye lid, three times a day. I would also recommend taking a daily non-sedating antihistamine such as Claritin or Zyrtec once a day in the morning. Should your symptoms fail to improve or worsen, follow up with your primary care provider or return to clinic.

## 2024-01-19 NOTE — Progress Notes (Signed)
 ORTHOPEDIC SPINE SURGERY CLINIC NOTE  [facility]   NEW PATIENT VISIT - CERVICAL SPINE  PATIENT:  Joseph Gibson DATE OF BIRTH:  May 16, 1966  CHIEF COMPLAINT:   Neck pain, right shoulder pain  Prior history: 2014 lumbar surgery  2016: neck and left arm pain Got better with PT and cervical steroid injection  HISTORY: Joseph Gibson is a 58 y.o. male seen in consultation today for evaluation of the above.   He states that he has been having neck pain and right shoulder pain.  He denies issues with fine motor function.  He has not had recent physical therapy for his neck.  He has not had recent cervical epidural steroid injections.  He is able to ambulate independently.  He can control his bowel bladder function.  PAST MEDICAL HISTORY Medical History[1]  PAST SURGICAL HISTORY Surgical History[2]  MEDICATIONS Current Medications[3]  ALLERGIES Allergies[4]  FAMILY HISTORY: Family History[5]  SOCIAL HISTORY: Lives in [place], [place] .  Tobacco Use: Low Risk  (11/28/2023)   Patient History   . Smoking Tobacco Use: Never   . Smokeless Tobacco Use: Never   . Passive Exposure: Not on file    REVIEW OF SYSTEMS: Constitutional: No fevers, chills, overall feeling well Respiratory:  No shortness of breath, no cough, no wheezing Cardiovascular:  No chest pain Gastrointestinal:  No nausea, vomiting, constipation, diarrhea, or abdominal pain  Additional review of systems:  Except as noted in the above review of systems and history of present illness, all other systems have been reviewed and are non-contributory  PHYSICAL EXAMINATION General:  Alert, oriented, appropriately interactive. Estimated body mass index is 23.38 kg/m as calculated from the following:   Height as of 01/21/24: 1.829 m (6').   Weight as of 01/21/24: 78.2 kg (172 lb 6.4 oz). Gait: Stable, unassisted gait Musculoskeletal:  Full active range of motion of all 4 extremities,  no clubbing cyanosis or edema.    Neurological:  SENSATION: Sensation intact to light touch in the C5-T1 distributions bilaterally  Strength Right Left  Deltoid 5 5  Biceps 5 5  Triceps 5 5  Wrist Flexion 5 5  Wrist Extension 5 5  Finger Flexion 5 5  Finger Extension 5 5  Interossei 5 5    Reflexes Right Left  Brachioradialis 1+ 1+  Biceps 1+ 1+  Triceps 1+ 1+  Patellar 1+ 1+  Achilles 1+ 1+  Hoffman's Negative  Negative   Cardiovascular:  Palpable radial pulses bilaterally with regular rhythm.  IMAGING: 1. Radiographs:  Cervical spine radiographs dated 01/19/2024 were available and independently reviewed in clinic today.  These demonstrate degenerative disc changes most notably at C5-6, C6-7.  No clear anterolisthesis appreciated.  2. MRI:  Cervical spine MRI dated 12/17/2023 was available and independently reviewed in clinic today.  This demonstrates  C3-4: No high-grade central canal stenosis mild-moderate bilateral foraminal stenosis C4-5: No high-grade central canal stenosis, moderate right foraminal stenosis C5-6: Decrease in CSF around the spinal cord, but no obvious deformation, moderate to severe bilateral foraminal stenosis C6-7: No high-grade central canal stenosis, moderate to severe bilateral foraminal stenosis C7-T1: No high-grade central canal nor foraminal stenosis appreciated.  ASSESSMENT:  Joseph Gibson is a 58 y.o. male with  Neck pain, right shoulder pain  Prior history: 2014 lumbar surgery  2016: neck and left arm pain Got better with PT and cervical steroid injection    We had a long conversation with Mr.Haynes regarding his diagnosis, etiology,  natural history, and treatment options, both nonsurgical and surgical.  He states that he did have relief with physical therapy and injections.  After discussion, plan for referral to the interventional pain clinic for consideration of nonoperative treatment modalities.  PLAN: 1.  After discussion, plan  for referral to the interventional pain clinic for consideration of nonoperative treatment modalities. 2.  We will also place a standing order for physical therapy. 3.  He is welcome to follow-up after the above.  At the conclusion of our visit, all of his questions were answered and he is comfortable with the plan. He knows to contact the clinic if any questions should arise.         [1] Past Medical History: Diagnosis Date  . Low testosterone   [2] Past Surgical History: Procedure Laterality Date  . BACK SURGERY     Procedure: BACK SURGERY  . HAND SURGERY     Procedure: HAND SURGERY  [3]  Current Outpatient Medications:  .  anastrozole (ARIMIDEX) 1 mg tablet, Take 1 tablet (1 mg total) by mouth every other day., Disp: 30 tablet, Rfl: 3 .  cholecalciferol (VITAMIN D3) 2,000 unit cap capsule, Take 2,000 Units by mouth Once Daily. (Patient not taking: Reported on 01/21/2024), Disp: 30 capsule, Rfl: 11 .  diazePAM (VALIUM) 5 mg tablet, Take 1 tablet (5 mg total) by mouth every 6 (six) hours as needed for anxiety. Medication refills require office visit every 6 months., Disp: 30 tablet, Rfl: 5 .  mesalamine  (LIALDA ) 1.2 gram EC tablet, Take 2.4 g by mouth every morning., Disp: , Rfl:  .  needle, disp, 21 G (Exel Hypodermic Needles) 21 gauge x 1 1/2 ndle, For T injection, Disp: 20 each, Rfl: 5 .  SUMAtriptan (IMITREX) 100 mg tablet, TAKE 1 TABLET BY MOUTH AT ONSET OF MIGRAINE. MAY REPEAT IN 2 HOURS AS NEEDED, Disp: 18 tablet, Rfl: 11 .  syringe with needle (BD Luer-Lok Syringe) 3 mL 18 x 1 1/2 syrg, For drawing T, Disp: 20 each, Rfl: 5 .  tadalafiL (CIALIS) 20 mg tablet, Take 0.5 tablets (10 mg total) by mouth daily as needed for erectile dysfunction., Disp: 30 tablet, Rfl: 5 .  testosterone cypionate (DEPO-TESTOSTERONE) 200 mg/mL injection, Inject 0.75 mL (150 mg total) into the muscle once a week., Disp: 10 mL, Rfl: 2 [4] No Known Allergies [5] Family History Problem Relation  Name Age of Onset  . Diabetes Father    . Cancer Other

## 2024-01-21 NOTE — Progress Notes (Signed)
 Joseph Gibson came for a follow up. He is 58 years old. He lives in [place] (40 minutes south of [place]). I have seen him on 07/23/23 which the evaluation and plan were:    He is doing well on TRT and as needed Cialis.  His last injection was on Monday evening.  He donated once since last visit.  We reviewed and discussed his previous lab result.  After a detailed conversation he agreed to the following plan.   1)T,  E2, CBC 2) Continue IM TRT (150 mg) every Tuesday via [facility] 3) Continue PRN Cialis via [facility] 4) Continue WB donation at least every 6 months 5) RPV in 5 months on a Friday    Latest Reference Range & Units 07/23/23 09:15  Estradiol Level <40 pg/mL 76 (H)  Testosterone, Total 150 - 684 ng/dL 8,789 (H)  WBC 5.59 - 88.99 10*3/uL 4.30 (L)  RBC 4.50 - 5.90 10*6/uL 5.58  Hemoglobin 14.0 - 17.5 g/dL 84.3  Hematocrit 58.4 - 50.4 % 47.4  Mean Corpuscular Volume (MCV) 80.0 - 96.0 fL 85.0  MCH 27.5 - 33.2 pg 27.9  Mean Corpuscular Hemoglobin Conc (MCHC) 33.0 - 37.0 g/dL 67.1 (L)  Red Cell Distribution Width (RDW-CV) 12.3 - 17.0 % 16.4  Platelet Count (Plt) 150 - 450 10*3/uL 220  Mean Platelet Volume (MPV) 6.8 - 10.2 fL 8.9  Neutrophils % % 60  Lymphocytes % % 26  Monocytes % % 13  Eosinophils % % 1  Basophils % % 0  nRBC % % 0  Neutrophils Absolute 1.80 - 7.80 10*3/uL 2.60  Lymphocytes Absolute 1.00 - 4.80 10*3/uL 1.10  Monocytes Absolute 0.00 - 0.80 10*3/uL 0.50  Eosinophils Absolute 0.00 - 0.50 10*3/uL 0.00  Basophils Absolute 0.00 - 0.20 10*3/uL 0.00  nRBC Absolute <=0.00 10*3/uL 0.00  (H): Data is abnormally high (L): Data is abnormally low   Latest Reference Range & Units 01/23/20 10:34 03/04/20 10:50 08/01/20 11:35 01/20/21 10:47 08/04/21 11:12 02/02/22 08:52 02/19/23 09:02 07/23/23 09:15  Testosterone, Total 150 - 684 ng/dL       113 (H) 8,789 (H)  HX FREE TESTOSTERONE, MAILOUT 4.06 - 15.6 ng/dL 8.03 (L) 86.2 47.9 (H)       HX TESTOSTERONE 150 - 684 NG/DL  671  [phone]     HX TESTOSTERONE BIO AVAILABLE, MAILOUT 50 - 190 ng/dL 23 (L)         HX TESTOSTERONE FREE PERCENT 1.50 - 4.20 %      3.76    HX TESTOSTERONE TOTAL, MAILOUT 240 - 950 ng/dL 98 (L) 597 8699 (H)       HX TESTOSTERONE, TOTAL, LC/MS 264.0 - 916.0 ng/dL      380.2    TESTOSTERONE,FREE 5.00 - 21.00 ng/dL      76.69 (H)    (H): Data is abnormally high (L): Data is abnormally low    Latest Reference Range & Units 03/04/20 10:50 08/01/20 11:35 01/20/21 10:47 08/04/21 11:12 02/02/22 08:52 02/19/23 09:02 07/23/23 09:15  Estradiol Level <40 pg/mL      62 (H) 76 (H)  Estradiol, Enhanced <39.8 PG/ML 20.2 75.8 (H) 41.1 (H) 38.8 33.3    (H): Data is abnormally high   Latest Reference Range & Units 12/11/19 13:34 03/04/20 10:50 08/01/20 11:35 01/20/21 10:47 07/02/21 14:34 08/04/21 11:12 02/02/22 08:52 08/21/22 08:57 02/19/23 09:02 07/23/23 09:15  WBC 4.40 - 11.00 10*3/uL 5.2 4.4 5.2 4.8 6.0 5.3 4.2 (L) 4.8 4.00 (L) 4.30 (L)  RBC 4.50 -  5.90 10*6/uL 5.14 5.57 5.74 5.64 5.33 5.72 5.25 5.65 4.93 5.58  Hemoglobin 14.0 - 17.5 g/dL 83.4 82.6 82.9 81.9 (H) 15.2 15.9 15.3 16.7 14.3 15.6  (L): Data is abnormally low (H): Data is abnormally high   Latest Reference Range & Units 03/04/20 10:50 01/20/21 10:47 08/04/21 11:12 08/21/22 08:57 02/19/23 12:56 09/17/23 10:24  Prostate Specific Antigen 0.00 - 3.50 ng/mL     1.04 1.02  PROSTATE SPECIFIC AG, SERUM 0.00 - 3.50 NG/ML 1.02 0.90 0.83 0.91      Physical Examination: His BMI is 23 and looks healthy. No gynecomastia. Penis is circumcised and normal. Both testes in scrotum: right 20 ml and left 20 ml. No varicocele.      Chief Complaints which were addressed today: Fatigue and ErectileDysfunction     Updated  Evaluation (Fatigue, Low libido and ED due to hypogonadism, Girlfriend 55 y old not living together): He is doing well on medications.  His last injection was on Monday.  He has donated whole blood twice since last visit.  He is trying  to gain a couple of more pounds but they did not happen. To me he is quite fit.  We reviewed and discussed his previous lab results.  After a detailed conversation he agreed to the following plan.     Updated Plan: 1) T, E2, 17HP, CBC 2) Continue IM TRT (150 mg) every Tuesday via [facility] 3) Continue PRN Cialis via [facility] 4) Continue WB donation at least every 3-6 months 5) RPV in 5 months on a Friday  I have personally spent 45 minutes involved in face-to-face and non-face-to-face activities for this patient on the day of the visit.

## 2024-03-17 NOTE — Progress Notes (Signed)
 Joseph Gibson is a 58 y.o.male.  Subjective:   This patient comes in today for routine healthcare issues largely doing very well he has a component of chronic kidney disease with a creatinine that stays elevated I suspect related to his use of testosterone he also has a component of iron deficiency with ferritin levels that is very low he follows along with GI for history of Crohn's disease which has been felt to be in remission with his IBD.  He also follows with urology for use of testosterone he is very compliant with that it makes him feel remarkably better but he has not been able which I suspect contributes to his iron deficiency though he has to donate blood because of testosterone increases the viscosity.  His testosterone levels have stayed stable he is currently on Arimidex every other day by urology for high estrogen levels as well He also was seen by orthopedics for neck discomfort received an injection which has significantly helped him, he follows along with ophthalmology for vision difficulty he has astigmatism that has been present with the right eye being in more the issue with that than the left. Plan: He indicated that he was not taking the Lialda  but I suspect he actually is, when I looked at the messages from January of it indicated to stay on the same dose 2.4 g/day repeat a colonoscopy in 1 year and then follow-up with the APP in 1 year and do CBCs and CMP through our office every 6 months as we have been continually doing.  He seems to be doing well from an allergy standpoint eustachian tube dysfunction not present at this time again urology follows with a BPH standpoint and any of the erectile needs and he said he has not really had to Cialis he has been doing fine from that standpoint.  There is been prediabetes with the labs his dad is a diabetic so we need to keep monitoring them as well as his lipid abnormalities which are very mild in nature.  I have also written for him to have  diazepam to use as needed from an anxiety standpoint he is very compliant with that and we will continue to write for him  _________________________________    Diagnoses this Encounter 1. Crohn's disease of large intestine without complication (HCC)      2. Migraine without status migrainosus, not intractable, unspecified migraine type      3. Dysfunction of both eustachian tubes      4. Degeneration of intervertebral disc of lumbar region with discogenic back pain      5. Benign prostatic hyperplasia without lower urinary tract symptoms      6. Anxiety disorder, unspecified type      7. Erectile dysfunction, unspecified erectile dysfunction type      8. High risk medication use      9. Iron deficiency  Comprehensive Metabolic Panel   Comprehensive Metabolic Panel    10. Low testosterone      11. Polycythemia  CBC with Differential   CBC with Differential    12. Vitamin D deficiency      13. Chews tobacco      14. Prediabetes  Hemoglobin A1C With Estimated Average Glucose   Lipid Panel   TSH   Hemoglobin A1C With Estimated Average Glucose   Lipid Panel   TSH      Chief Complaint  Patient presents with  . 6 month chronic problem and medication management   Physical Exam  Constitutional:      Appearance: Normal appearance.  HENT:     Head: Normocephalic.     Right Ear: Tympanic membrane normal.     Left Ear: Tympanic membrane normal.     Nose: No congestion.     Mouth/Throat:     Mouth: Mucous membranes are moist.   Eyes:     Pupils: Pupils are equal, round, and reactive to light.    Cardiovascular:     Rate and Rhythm: Normal rate and regular rhythm.     Pulses: Normal pulses.     Heart sounds: Normal heart sounds.  Pulmonary:     Breath sounds: Normal breath sounds.  Abdominal:     Palpations: Abdomen is soft.   Musculoskeletal:        General: No swelling or tenderness.     Cervical back: Normal range of motion.   Skin:    General: Skin is  dry.   Neurological:     Mental Status: He is alert and oriented to person, place, and time.   Psychiatric:        Mood and Affect: Mood normal.        Behavior: Behavior normal.   This patient comes in today for follow-up related to his health care issues he is largely doing really well  ______________________ Vitals:   03/17/24 0859  BP: 122/80  BP Location: Left arm  Patient Position: Sitting  Pulse: 98  Resp: 16  Temp: 97.8 F (36.6 C)  TempSrc: Temporal  Weight: 77.1 kg (170 lb)  Height: 1.829 m (6')    Patient's Medications  New Prescriptions   No medications on file  Previous Medications   ANASTROZOLE (ARIMIDEX) 1 MG TABLET    Take 1 tablet (1 mg total) by mouth every other day.   CHOLECALCIFEROL (VITAMIN D3) 2,000 UNIT CAP CAPSULE    Take 2,000 Units by mouth Once Daily.   DIAZEPAM (VALIUM) 5 MG TABLET    Take 1 tablet (5 mg total) by mouth every 6 (six) hours as needed for anxiety. Medication refills require office visit every 6 months.   GABAPENTIN (NEURONTIN) 300 MG CAPSULE    Take 300 mg by mouth nightly.   MESALAMINE  (LIALDA ) 1.2 GRAM EC TABLET    Take 2.4 g by mouth every morning.   NEEDLE, DISP, 21 G (EXEL HYPODERMIC NEEDLES) 21 GAUGE X 1 1/2 NDLE    For T injection   SUMATRIPTAN (IMITREX) 100 MG TABLET    TAKE 1 TABLET BY MOUTH AT ONSET OF MIGRAINE. MAY REPEAT IN 2 HOURS AS NEEDED   SYRINGE WITH NEEDLE (BD LUER-LOK SYRINGE) 3 ML 18 X 1 1/2 SYRG    For drawing T   TADALAFIL (CIALIS) 20 MG TABLET    Take 0.5 tablets (10 mg total) by mouth daily as needed for erectile dysfunction.   TESTOSTERONE CYPIONATE (DEPO-TESTOSTERONE) 200 MG/ML INJECTION    Inject 0.75 mL (150 mg total) into the muscle once a week.  Modified Medications   No medications on file  Discontinued Medications   No medications on file   Orders Placed This Encounter  Procedures  . CBC with Differential  . Comprehensive Metabolic Panel  . Hemoglobin A1C With Estimated Average Glucose  .  Lipid Panel  . TSH  . CBC with Differential   Problem List[1] Medical History[2] Surgical History[3] Family History[4] Social History   Socioeconomic History  . Marital status: Single    Spouse name: Not on file  . Number  of children: Not on file  . Years of education: Not on file  . Highest education level: Not on file  Occupational History  . Not on file  Tobacco Use  . Smoking status: Never  . Smokeless tobacco: Never  Substance and Sexual Activity  . Alcohol use: Yes  . Drug use: Never  . Sexual activity: Not on file  Other Topics Concern  . Not on file  Social History Narrative  . Not on file   Social Drivers of Health   Food Insecurity: Low Risk  (03/17/2024)   Food vital sign   . Within the past 12 months, you worried that your food would run out before you got money to buy more: Never true   . Within the past 12 months, the food you bought just didn't last and you didn't have money to get more: Never true  Transportation Needs: No Transportation Needs (03/17/2024)   Transportation   . In the past 12 months, has lack of reliable transportation kept you from medical appointments, meetings, work or from getting things needed for daily living? : No  Safety: Low Risk  (03/17/2024)   Safety   . How often does anyone, including family and friends, physically hurt you?: Never   . How often does anyone, including family and friends, insult or talk down to you?: Never   . How often does anyone, including family and friends, threaten you with harm?: Never   . How often does anyone, including family and friends, scream or curse at you?: Never  Living Situation: Low Risk  (03/17/2024)   Living Situation   . What is your living situation today?: I have a steady place to live   . Think about the place you live. Do you have problems with any of the following? Choose all that apply:: None/None on this list   Allergies[5]  Review of Systems  Constitutional:  Positive for fatigue.  Negative for activity change.  HENT:  Negative for ear discharge, ear pain, postnasal drip, sinus pressure, sinus pain and sore throat.   Respiratory:  Negative for cough.   Cardiovascular:  Negative for chest pain and leg swelling.  Gastrointestinal:  Negative for constipation and diarrhea.  Genitourinary:  Negative for flank pain.  Musculoskeletal:  Negative for arthralgias and back pain.  Neurological:  Negative for dizziness and seizures.  Psychiatric/Behavioral:  Negative for behavioral problems.   All other systems reviewed and are negative.  `      [1] Patient Active Problem List Diagnosis  . BPH (benign prostatic hyperplasia)  . Degeneration of lumbar intervertebral disc  . High risk medication use  . Eustachian tube dysfunction  . Hypogonadism male  . Vitamin D deficiency  . Chews tobacco  . Migraine without status migrainosus, not intractable  . Anxiety disorder  . Polycythemia  . Iron deficiency  . Low testosterone  . Erectile dysfunction  . Crohn's disease of large intestine without complication (HCC)  . Radiculopathy of cervical spine  . Chronic right shoulder pain  . Right shoulder injury  . 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 Low testosterone   [3] Past Surgical History: Procedure Laterality Date  . BACK SURGERY     Procedure: BACK SURGERY  . HAND SURGERY     Procedure: HAND SURGERY  [4] Family History Problem Relation Name Age of Onset  . Diabetes Father    . Cancer Other    [  5] No Known Allergies

## 2024-03-24 NOTE — Telephone Encounter (Signed)
 Left voicemail for patient to return call.

## 2024-03-24 NOTE — Telephone Encounter (Signed)
 Patient lvm  re lialda  refill was sent called lvm re this and to call back if questions

## 2024-03-27 NOTE — Telephone Encounter (Signed)
 Just a FYI  I have spoke with patient on message below. He has already picked up his medication and is good for now.  No need for call back

## 2024-03-27 NOTE — Telephone Encounter (Signed)
 LVM for patient to call office.

## 2024-03-27 NOTE — Telephone Encounter (Signed)
 Noted thanks
